# Patient Record
Sex: Female | Born: 1995 | Race: Asian | Hispanic: No | Marital: Married | Smoking: Never smoker
Health system: Southern US, Community
[De-identification: ages and names within clinical notes are randomized; demographics above are authoritative.]

## PROBLEM LIST (undated history)

## (undated) DIAGNOSIS — Z789 Other specified health status: Secondary | ICD-10-CM

## (undated) HISTORY — PX: NO PAST SURGERIES: SHX2092

---

## 2017-01-31 ENCOUNTER — Encounter: Payer: Self-pay | Admitting: *Deleted

## 2017-01-31 LAB — PREGNANCY, URINE: PREG TEST UR: POSITIVE

## 2017-02-20 ENCOUNTER — Encounter: Payer: Self-pay | Admitting: Obstetrics and Gynecology

## 2017-02-20 ENCOUNTER — Ambulatory Visit (INDEPENDENT_AMBULATORY_CARE_PROVIDER_SITE_OTHER): Payer: Medicaid Other | Admitting: Obstetrics and Gynecology

## 2017-02-20 DIAGNOSIS — Z3402 Encounter for supervision of normal first pregnancy, second trimester: Secondary | ICD-10-CM | POA: Diagnosis present

## 2017-02-20 DIAGNOSIS — Z331 Pregnant state, incidental: Secondary | ICD-10-CM | POA: Diagnosis not present

## 2017-02-20 DIAGNOSIS — Z1389 Encounter for screening for other disorder: Secondary | ICD-10-CM | POA: Diagnosis not present

## 2017-02-20 DIAGNOSIS — Z113 Encounter for screening for infections with a predominantly sexual mode of transmission: Secondary | ICD-10-CM

## 2017-02-20 DIAGNOSIS — Z3400 Encounter for supervision of normal first pregnancy, unspecified trimester: Secondary | ICD-10-CM

## 2017-02-20 DIAGNOSIS — O0990 Supervision of high risk pregnancy, unspecified, unspecified trimester: Secondary | ICD-10-CM | POA: Insufficient documentation

## 2017-02-20 LAB — POCT URINALYSIS DIP (DEVICE)
BILIRUBIN URINE: NEGATIVE
GLUCOSE, UA: NEGATIVE mg/dL
KETONES UR: NEGATIVE mg/dL
Nitrite: NEGATIVE
PROTEIN: 30 mg/dL — AB
Urobilinogen, UA: 0.2 mg/dL (ref 0.0–1.0)
pH: 6.5 (ref 5.0–8.0)

## 2017-02-20 MED ORDER — PRENATAL VITAMINS 28-0.8 MG PO TABS: 1.0000 | ORAL_TABLET | Freq: Every day | ORAL | 3 refills | Status: DC

## 2017-02-20 NOTE — Progress Notes (Signed)
     Subjective:   Donna Chen is a 21 y.o. G1P0 at [redacted]w[redacted]d by LMP being seen today for her first obstetrical visit.  Her obstetrical history is significant for healthy female . From [place], speaks english. Has been in [place] for 5 years.  Patient does intend to breast feed. Pregnancy history fully reviewed. FOB involved, living together. Planned pregnancy.   Patient reports no complaints.  HISTORY: Obstetric History   G1   P0   T0   P0   A0   L0    SAB0   TAB0   Ectopic0   Multiple0   Live Births0     # Outcome Date GA Lbr Len/2nd Weight Sex Delivery Anes PTL Lv  1 Current              History reviewed. No pertinent past medical history. History reviewed. No pertinent surgical history. History reviewed. No pertinent family history. Social History  Substance Use Topics  . Smoking status: Never Smoker  . Smokeless tobacco: Never Used  . Alcohol use No   Not on File No current outpatient prescriptions on file prior to visit.   No current facility-administered medications on file prior to visit.      Exam   Vitals:   02/20/17 0942  BP: 113/70  Pulse: 77  Weight: 113 lb 11.2 oz (51.6 kg)   Fetal Heart Rate (bpm): 155  Uterus:     Pelvic Exam: Perineum: no hemorrhoids, normal perineum   Vulva: normal external genitalia, no lesions   Vagina:  normal mucosa, normal discharge   Bony Pelvis: Average-small  System: General: well-developed, well-nourished female in no acute distress   Breast:  normal appearance, no masses or tenderness   Skin: normal coloration and turgor, no rashes   Neurologic: oriented, normal, negative, normal mood   Extremities: normal strength, tone, and muscle mass, ROM of all joints is normal   HEENT PERRLA, extraocular movement intact and sclera clear, anicteric   Mouth/Teeth mucous membranes moist, pharynx normal without lesions and dental hygiene good   Neck supple and no masses   Cardiovascular: regular rate and rhythm   Respiratory:  no  respiratory distress, normal breath sounds   Abdomen: soft, non-tender; bowel sounds normal; no masses,  no organomegaly     Assessment:   Pregnancy: G1P0 Patient Active Problem List   Diagnosis Date Noted  . Supervision of normal first pregnancy, antepartum 02/20/2017     Plan:   1. Supervision of normal first pregnancy, antepartum  - Obstetric Panel, Including HIV - Culture, OB Urine - Hemoglobinopathy evaluation - Cervicovaginal ancillary only - [place] MFM OB COMP + 14 WK; Future   Initial labs drawn. Continue prenatal vitamins. RX sent  Genetic Screening discusseddeclined at this time. Patient is self pay. Has applied for Medicaid. Would like to wait to discuss once medicaid is approved and active.  Ultrasound discussed; fetal anatomic survey: Ordered and scheduled  Problem list reviewed and updated. The nature of [facility] with multiple MDs and other Advanced Practice Providers was explained to patient; also emphasized that residents, students are part of our team. Routine obstetric precautions reviewed. Return in about 4 weeks (around 03/20/2017).   Rasch, Harolyn Rutherford, NP  [facility] [employer], [facility]

## 2017-02-21 LAB — HEMOGLOBINOPATHY EVALUATION
HEMOGLOBIN A2 QUANTITATION: 2.6 % (ref 1.8–3.2)
HEMOGLOBIN F QUANTITATION: 0 % (ref 0.0–2.0)
HGB A: 97.4 % (ref 96.4–98.8)
HGB C: 0 %
HGB S: 0 %
HGB VARIANT: 0 %

## 2017-02-21 LAB — OBSTETRIC PANEL, INCLUDING HIV
Antibody Screen: NEGATIVE
BASOS ABS: 0 10*3/uL (ref 0.0–0.2)
Basos: 0 %
EOS (ABSOLUTE): 0.1 10*3/uL (ref 0.0–0.4)
Eos: 1 %
HIV Screen 4th Generation wRfx: NONREACTIVE
Hematocrit: 35.4 % (ref 34.0–46.6)
Hemoglobin: 11.5 g/dL (ref 11.1–15.9)
Hepatitis B Surface Ag: NEGATIVE
IMMATURE GRANS (ABS): 0.1 10*3/uL (ref 0.0–0.1)
IMMATURE GRANULOCYTES: 1 %
LYMPHS: 15 %
Lymphocytes Absolute: 1.5 10*3/uL (ref 0.7–3.1)
MCH: 27.2 pg (ref 26.6–33.0)
MCHC: 32.5 g/dL (ref 31.5–35.7)
MCV: 84 fL (ref 79–97)
MONOCYTES: 4 %
MONOS ABS: 0.4 10*3/uL (ref 0.1–0.9)
NEUTROS PCT: 79 %
Neutrophils Absolute: 8 10*3/uL — ABNORMAL HIGH (ref 1.4–7.0)
Platelets: 215 10*3/uL (ref 150–379)
RBC: 4.23 x10E6/uL (ref 3.77–5.28)
RDW: 13.6 % (ref 12.3–15.4)
RPR Ser Ql: NONREACTIVE
RUBELLA: 2.06 {index} (ref >0.99)
Rh Factor: POSITIVE
WBC: 10.1 10*3/uL (ref 3.4–10.8)

## 2017-02-21 LAB — CERVICOVAGINAL ANCILLARY ONLY
BACTERIAL VAGINITIS: NEGATIVE
Candida vaginitis: POSITIVE — AB
Chlamydia: NEGATIVE
NEISSERIA GONORRHEA: NEGATIVE
TRICH (WINDOWPATH): NEGATIVE

## 2017-02-22 ENCOUNTER — Telehealth: Payer: Self-pay | Admitting: Obstetrics and Gynecology

## 2017-02-22 LAB — CULTURE, OB URINE

## 2017-02-22 LAB — URINE CULTURE, OB REFLEX

## 2017-02-22 MED ORDER — TERCONAZOLE 0.4 % VA CREA: 1.0000 | TOPICAL_CREAM | Freq: Every day | VAGINAL | 0 refills | Status: DC

## 2017-02-22 NOTE — Telephone Encounter (Signed)
+   yeast infection Terazol cream sent to pharmacy.    Roslynn Holte, Harolyn Rutherford, NP

## 2017-03-20 ENCOUNTER — Ambulatory Visit (INDEPENDENT_AMBULATORY_CARE_PROVIDER_SITE_OTHER): Payer: Medicaid Other | Admitting: Obstetrics & Gynecology

## 2017-03-20 DIAGNOSIS — Z3400 Encounter for supervision of normal first pregnancy, unspecified trimester: Secondary | ICD-10-CM

## 2017-03-20 DIAGNOSIS — Z23 Encounter for immunization: Secondary | ICD-10-CM

## 2017-03-20 DIAGNOSIS — Z3402 Encounter for supervision of normal first pregnancy, second trimester: Secondary | ICD-10-CM

## 2017-03-20 MED ORDER — TERCONAZOLE 0.4 % VA CREA: 1.0000 | TOPICAL_CREAM | Freq: Every day | VAGINAL | 0 refills | Status: DC

## 2017-03-20 NOTE — Progress Notes (Signed)
   PRENATAL VISIT NOTE  Subjective:  Donna Chen is a 21 y.o. G1P0 at [redacted]w[redacted]d being seen today for ongoing prenatal care.  She is currently monitored for the following issues for this low-risk pregnancy and has Supervision of normal first pregnancy, antepartum on her problem list.  Patient reports no complaints.  Contractions: Not present. Vag. Bleeding: None.  Movement: Absent. Denies leaking of fluid.   The following portions of the patient's history were reviewed and updated as appropriate: allergies, current medications, past family history, past medical history, past social history, past surgical history and problem list. Problem list updated.  Objective:   Vitals:   03/20/17 1056 03/20/17 1100  BP: 126/77   Pulse: 80   Weight: 117 lb 14.4 oz (53.5 kg)   Height:   (1.448 m)    Fetal Status:     Movement: Absent     General:  Alert, oriented and cooperative. Patient is in no acute distress.  Skin: Skin is warm and dry. No rash noted.   Cardiovascular: Normal heart rate noted  Respiratory: Normal respiratory effort, no problems with respiration noted  Abdomen: Soft, gravid, appropriate for gestational age.  Pain/Pressure: Absent     Pelvic: Cervical exam deferred        Extremities: Normal range of motion.  Edema: None  Mental Status:  Normal mood and affect. Normal behavior. Normal judgment and thought content.   Assessment and Plan:  Pregnancy: G1P0 at [redacted]w[redacted]d  1. Supervision of normal first pregnancy, antepartum -declines all genetic screening -Pt did not complete Terazol.  Will reorder.  Still having some itching.    Preterm labor symptoms and general obstetric precautions including but not limited to vaginal bleeding, contractions, leaking of fluid and fetal movement were reviewed in detail with the patient. Please refer to After Visit Summary for other counseling recommendations.  Return in about 4 weeks (around 04/17/2017).   Elsie Lincoln, MD

## 2017-03-20 NOTE — Addendum Note (Signed)
Addended by: Lorelle Gibbs L on: 03/20/2017 11:32 AM   Modules accepted: Orders

## 2017-03-25 ENCOUNTER — Other Ambulatory Visit: Payer: Self-pay | Admitting: Obstetrics and Gynecology

## 2017-03-25 ENCOUNTER — Ambulatory Visit (HOSPITAL_COMMUNITY)
Admission: RE | Admit: 2017-03-25 | Discharge: 2017-03-25 | Disposition: A | Discharge status: Home / Self Care | Payer: Medicaid Other | Source: Ambulatory Visit | Attending: Obstetrics and Gynecology | Admitting: Obstetrics and Gynecology

## 2017-03-25 ENCOUNTER — Other Ambulatory Visit (HOSPITAL_COMMUNITY): Payer: Self-pay | Admitting: *Deleted

## 2017-03-25 DIAGNOSIS — Z3A19 19 weeks gestation of pregnancy: Secondary | ICD-10-CM | POA: Diagnosis not present

## 2017-03-25 DIAGNOSIS — O30032 Twin pregnancy, monochorionic/diamniotic, second trimester: Secondary | ICD-10-CM | POA: Insufficient documentation

## 2017-03-25 DIAGNOSIS — O30039 Twin pregnancy, monochorionic/diamniotic, unspecified trimester: Secondary | ICD-10-CM

## 2017-03-25 DIAGNOSIS — Z3689 Encounter for other specified antenatal screening: Secondary | ICD-10-CM

## 2017-03-25 DIAGNOSIS — Z3400 Encounter for supervision of normal first pregnancy, unspecified trimester: Secondary | ICD-10-CM

## 2017-03-25 NOTE — Addendum Note (Signed)
Encounter addended by: Levonne Hubert, RDMS, RVT on: 03/25/2017  4:17 PM<BR>    Actions taken: Imaging Exam ended

## 2017-04-08 ENCOUNTER — Other Ambulatory Visit (HOSPITAL_COMMUNITY): Payer: Self-pay | Admitting: Maternal and Fetal Medicine

## 2017-04-08 ENCOUNTER — Ambulatory Visit (HOSPITAL_COMMUNITY)
Admission: RE | Admit: 2017-04-08 | Discharge: 2017-04-08 | Disposition: A | Discharge status: Home / Self Care | Payer: Medicaid Other | Source: Ambulatory Visit | Attending: Obstetrics and Gynecology | Admitting: Obstetrics and Gynecology

## 2017-04-08 ENCOUNTER — Encounter (HOSPITAL_COMMUNITY): Payer: Self-pay

## 2017-04-08 DIAGNOSIS — O30039 Twin pregnancy, monochorionic/diamniotic, unspecified trimester: Secondary | ICD-10-CM

## 2017-04-08 DIAGNOSIS — O30032 Twin pregnancy, monochorionic/diamniotic, second trimester: Secondary | ICD-10-CM | POA: Insufficient documentation

## 2017-04-08 DIAGNOSIS — Z3A21 21 weeks gestation of pregnancy: Secondary | ICD-10-CM | POA: Diagnosis not present

## 2017-04-08 HISTORY — DX: Other specified health status: Z78.9

## 2017-04-09 ENCOUNTER — Other Ambulatory Visit (HOSPITAL_COMMUNITY): Payer: Self-pay | Admitting: *Deleted

## 2017-04-09 DIAGNOSIS — O30032 Twin pregnancy, monochorionic/diamniotic, second trimester: Secondary | ICD-10-CM

## 2017-04-17 ENCOUNTER — Ambulatory Visit (INDEPENDENT_AMBULATORY_CARE_PROVIDER_SITE_OTHER): Payer: Medicaid Other | Admitting: Obstetrics and Gynecology

## 2017-04-17 VITALS — BP 122/79 | HR 80 | Wt 123.9 lb

## 2017-04-17 DIAGNOSIS — O30039 Twin pregnancy, monochorionic/diamniotic, unspecified trimester: Secondary | ICD-10-CM

## 2017-04-17 DIAGNOSIS — O30032 Twin pregnancy, monochorionic/diamniotic, second trimester: Secondary | ICD-10-CM | POA: Diagnosis not present

## 2017-04-17 DIAGNOSIS — O0992 Supervision of high risk pregnancy, unspecified, second trimester: Secondary | ICD-10-CM | POA: Diagnosis not present

## 2017-04-17 MED ORDER — ASPIRIN EC 81 MG PO TBEC: 81.0000 mg | DELAYED_RELEASE_TABLET | Freq: Every day | ORAL | 2 refills | Status: DC

## 2017-04-17 NOTE — Progress Notes (Signed)
Subjective:  Donna Chen is a 21 y.o. G1P0 at 7072w4d being seen today for ongoing prenatal care.  She is currently monitored for the following issues for this high-risk pregnancy and has Pregnancy, supervision, high-risk and Monochorionic diamniotic twin pregnancy, antepartum on her problem list.  Patient reports no complaints.  Contractions: Not present. Vag. Bleeding: None.  Movement: Present. Denies leaking of fluid.   The following portions of the patient's history were reviewed and updated as appropriate: allergies, current medications, past family history, past medical history, past social history, past surgical history and problem list. Problem list updated.  Objective:   Vitals:   04/17/17 1035  BP: 122/79  Pulse: 80  Weight: 123 lb 14.4 oz (56.2 kg)    Fetal Status: Fetal Heart Rate (bpm): 157/150   Movement: Present     General:  Alert, oriented and cooperative. Patient is in no acute distress.  Skin: Skin is warm and dry. No rash noted.   Cardiovascular: Normal heart rate noted  Respiratory: Normal respiratory effort, no problems with respiration noted  Abdomen: Soft, gravid, appropriate for gestational age. Pain/Pressure: Present     Pelvic:  Cervical exam deferred        Extremities: Normal range of motion.  Edema: None  Mental Status: Normal mood and affect. Normal behavior. Normal judgment and thought content.   Urinalysis:      Assessment and Plan:  Pregnancy: G1P0 at 3672w4d  1. Monochorionic diamniotic twin pregnancy, antepartum U/S follow next week - aspirin EC 81 MG tablet; Take 1 tablet (81 mg total) by mouth daily. Take after 12 weeks for prevention of preeclampsia later in pregnancy  Dispense: 300 tablet; Refill: 2  2. Supervision of high risk pregnancy in second trimester Declines flu vaccine Declines genetic testing Glucola next visit  Preterm labor symptoms and general obstetric precautions including but not limited to vaginal bleeding, contractions,  leaking of fluid and fetal movement were reviewed in detail with the patient. Please refer to After Visit Summary for other counseling recommendations.  Return in about 4 weeks (around 05/15/2017) for OB visit.   Hermina StaggersErvin, Ethal Gotay L, MD

## 2017-04-17 NOTE — Progress Notes (Signed)
Declined genetic testing.  

## 2017-04-22 ENCOUNTER — Other Ambulatory Visit (HOSPITAL_COMMUNITY): Payer: Self-pay | Admitting: Maternal and Fetal Medicine

## 2017-04-22 ENCOUNTER — Encounter (HOSPITAL_COMMUNITY): Payer: Self-pay

## 2017-04-22 ENCOUNTER — Ambulatory Visit (HOSPITAL_COMMUNITY)
Admission: RE | Admit: 2017-04-22 | Discharge: 2017-04-22 | Disposition: A | Discharge status: Home / Self Care | Payer: Medicaid Other | Source: Ambulatory Visit | Attending: Obstetrics and Gynecology | Admitting: Obstetrics and Gynecology

## 2017-04-22 DIAGNOSIS — O30032 Twin pregnancy, monochorionic/diamniotic, second trimester: Secondary | ICD-10-CM | POA: Insufficient documentation

## 2017-04-22 DIAGNOSIS — O43022 Fetus-to-fetus placental transfusion syndrome, second trimester: Secondary | ICD-10-CM | POA: Insufficient documentation

## 2017-04-22 DIAGNOSIS — O30039 Twin pregnancy, monochorionic/diamniotic, unspecified trimester: Secondary | ICD-10-CM

## 2017-04-22 DIAGNOSIS — Z3A23 23 weeks gestation of pregnancy: Secondary | ICD-10-CM | POA: Diagnosis not present

## 2017-04-28 ENCOUNTER — Encounter (HOSPITAL_COMMUNITY): Payer: Self-pay

## 2017-05-06 ENCOUNTER — Ambulatory Visit (HOSPITAL_COMMUNITY)
Admission: RE | Admit: 2017-05-06 | Discharge: 2017-05-06 | Disposition: A | Discharge status: Home / Self Care | Payer: Medicaid Other | Source: Ambulatory Visit | Attending: Obstetrics and Gynecology | Admitting: Obstetrics and Gynecology

## 2017-05-06 ENCOUNTER — Encounter (HOSPITAL_COMMUNITY): Payer: Self-pay

## 2017-05-06 ENCOUNTER — Other Ambulatory Visit (HOSPITAL_COMMUNITY): Payer: Self-pay | Admitting: Maternal and Fetal Medicine

## 2017-05-06 DIAGNOSIS — O30032 Twin pregnancy, monochorionic/diamniotic, second trimester: Secondary | ICD-10-CM | POA: Insufficient documentation

## 2017-05-06 DIAGNOSIS — IMO0002 Reserved for concepts with insufficient information to code with codable children: Secondary | ICD-10-CM

## 2017-05-06 DIAGNOSIS — Z3A25 25 weeks gestation of pregnancy: Secondary | ICD-10-CM

## 2017-05-06 DIAGNOSIS — O365922 Maternal care for other known or suspected poor fetal growth, second trimester, fetus 2: Secondary | ICD-10-CM | POA: Diagnosis not present

## 2017-05-06 DIAGNOSIS — O30039 Twin pregnancy, monochorionic/diamniotic, unspecified trimester: Secondary | ICD-10-CM

## 2017-05-07 ENCOUNTER — Other Ambulatory Visit (HOSPITAL_COMMUNITY): Payer: Self-pay | Admitting: *Deleted

## 2017-05-07 DIAGNOSIS — O365990 Maternal care for other known or suspected poor fetal growth, unspecified trimester, not applicable or unspecified: Secondary | ICD-10-CM

## 2017-05-13 ENCOUNTER — Other Ambulatory Visit (HOSPITAL_COMMUNITY): Payer: Self-pay | Admitting: Maternal and Fetal Medicine

## 2017-05-13 ENCOUNTER — Other Ambulatory Visit: Payer: Self-pay

## 2017-05-13 ENCOUNTER — Inpatient Hospital Stay (HOSPITAL_COMMUNITY)
Admission: AD | Admit: 2017-05-13 | Discharge: 2017-05-16 | DRG: 833 | Disposition: A | Discharge status: Home / Self Care | Payer: Medicaid Other | Source: Ambulatory Visit | Attending: Obstetrics and Gynecology | Admitting: Obstetrics and Gynecology

## 2017-05-13 ENCOUNTER — Encounter (HOSPITAL_COMMUNITY): Payer: Self-pay

## 2017-05-13 ENCOUNTER — Ambulatory Visit (HOSPITAL_COMMUNITY)
Admission: RE | Admit: 2017-05-13 | Discharge: 2017-05-13 | Disposition: A | Discharge status: Home / Self Care | Payer: Medicaid Other | Source: Ambulatory Visit | Attending: Obstetrics and Gynecology | Admitting: Obstetrics and Gynecology

## 2017-05-13 DIAGNOSIS — O30032 Twin pregnancy, monochorionic/diamniotic, second trimester: Secondary | ICD-10-CM | POA: Diagnosis present

## 2017-05-13 DIAGNOSIS — O30009 Twin pregnancy, unspecified number of placenta and unspecified number of amniotic sacs, unspecified trimester: Secondary | ICD-10-CM | POA: Diagnosis present

## 2017-05-13 DIAGNOSIS — O365990 Maternal care for other known or suspected poor fetal growth, unspecified trimester, not applicable or unspecified: Secondary | ICD-10-CM

## 2017-05-13 DIAGNOSIS — O365992 Maternal care for other known or suspected poor fetal growth, unspecified trimester, fetus 2: Secondary | ICD-10-CM

## 2017-05-13 DIAGNOSIS — O283 Abnormal ultrasonic finding on antenatal screening of mother: Secondary | ICD-10-CM

## 2017-05-13 DIAGNOSIS — Z7982 Long term (current) use of aspirin: Secondary | ICD-10-CM

## 2017-05-13 DIAGNOSIS — Z3A26 26 weeks gestation of pregnancy: Secondary | ICD-10-CM

## 2017-05-13 DIAGNOSIS — O0990 Supervision of high risk pregnancy, unspecified, unspecified trimester: Secondary | ICD-10-CM

## 2017-05-13 DIAGNOSIS — O365912 Maternal care for other known or suspected poor fetal growth, first trimester, fetus 2: Secondary | ICD-10-CM

## 2017-05-13 DIAGNOSIS — O365922 Maternal care for other known or suspected poor fetal growth, second trimester, fetus 2: Principal | ICD-10-CM | POA: Diagnosis present

## 2017-05-13 LAB — CBC
HCT: 35.4 % — ABNORMAL LOW (ref 36.0–46.0)
Hemoglobin: 11.7 g/dL — ABNORMAL LOW (ref 12.0–15.0)
MCH: 27.7 pg (ref 26.0–34.0)
MCHC: 33.1 g/dL (ref 30.0–36.0)
MCV: 83.9 fL (ref 78.0–100.0)
PLATELETS: 244 10*3/uL (ref 150–400)
RBC: 4.22 MIL/uL (ref 3.87–5.11)
RDW: 13.6 % (ref 11.5–15.5)
WBC: 12.7 10*3/uL — ABNORMAL HIGH (ref 4.0–10.5)

## 2017-05-13 LAB — TYPE AND SCREEN
ABO/RH(D): B POS
Antibody Screen: NEGATIVE

## 2017-05-13 LAB — ABO/RH: ABO/RH(D): B POS

## 2017-05-13 MED ORDER — CALCIUM CARBONATE ANTACID 500 MG PO CHEW: 2.0000 | CHEWABLE_TABLET | ORAL | Status: DC | PRN

## 2017-05-13 MED ORDER — PRENATAL MULTIVITAMIN CH
1.0000 | ORAL_TABLET | Freq: Every day | ORAL | Status: DC
  Administered 2017-05-14 – 2017-05-16 (×3): 1 via ORAL
  Filled 2017-05-13 (×3): qty 1

## 2017-05-13 MED ORDER — BETAMETHASONE SOD PHOS & ACET 6 (3-3) MG/ML IJ SUSP
12.0000 mg | INTRAMUSCULAR | Status: AC
  Administered 2017-05-13 – 2017-05-14 (×2): 12 mg via INTRAMUSCULAR
  Filled 2017-05-13 (×2): qty 2

## 2017-05-13 MED ORDER — DOCUSATE SODIUM 100 MG PO CAPS
100.0000 mg | ORAL_CAPSULE | Freq: Every day | ORAL | Status: DC
  Administered 2017-05-14 – 2017-05-16 (×2): 100 mg via ORAL
  Filled 2017-05-13 (×2): qty 1

## 2017-05-13 MED ORDER — ACETAMINOPHEN 325 MG PO TABS: 650.0000 mg | ORAL_TABLET | ORAL | Status: DC | PRN

## 2017-05-13 MED ORDER — LACTATED RINGERS IV SOLN
INTRAVENOUS | Status: DC
  Administered 2017-05-13 – 2017-05-14 (×2): via INTRAVENOUS

## 2017-05-13 NOTE — H&P (Signed)
Obstetric History and Physical  Donna Chen is a 21 y.o. G1P0 with mono/di twins at [redacted]w[redacted]d admitted for UA dopplers with persistent and intermittently reversed end diastolic flow in Twin B. Growth discordance today 22%. She previously (10/23) was noted to have growth discordance of 28% and abnormal umbilical artery dopplers in Twin B and was sent to [facility] for concern for Stage III TTTS. She reports they told her everything was normal. Returned today for follow up US and was noted to have intermittently reversed end diastolic flow in Twin B today.   Patient has no complaints today. Denies contractions, leaking, bleeding. Reports normal fetal movement. Reports aside from growth issues, she has had no issues with pregnancy.     Prenatal Course Source of Care: [facility]  with onset of care at 14 weeks Pregnancy complications or risks: Patient Active Problem List   Diagnosis Date Noted  . Inter-twin discordance, antepartum 05/13/2017  . Twin pregnancy, twins discordant 05/13/2017  . Monochorionic diamniotic twin pregnancy, antepartum 04/17/2017  . Pregnancy, supervision, high-risk 02/20/2017   Prenatal labs and studies: ABO, Rh: --/--/B POS (11/13 1504) Antibody: NEG (11/13 1504) Rubella: 2.06 (08/23 1023) RPR: Non Reactive (08/23 1023)  HBsAg: Negative (08/23 1023)  HIV:   non-reactive GBS: not done yet 2 hr Glucola  Not done yet Genetic screening declined Anatomy US normal  Prenatal Transfer Tool  Maternal Diabetes: not yet tested Genetic Screening: Declined Maternal Ultrasounds/Referrals: Abnormal:  Findings:   Other: Fetal Ultrasounds or other Referrals:  Referred to Materal Fetal Medicine  Maternal Substance Abuse:  No Significant Maternal Medications:  None Significant Maternal Lab Results: None  Past Medical History:  Diagnosis Date  . Medical history non-contributory     Past Surgical History:  Procedure Laterality Date  . NO PAST SURGERIES      OB History  Gravida Para  Term Preterm AB Living  1         0  SAB TAB Ectopic Multiple Live Births               # Outcome Date GA Lbr Len/2nd Weight Sex Delivery Anes PTL Lv  1 Current               Social History   Socioeconomic History  . Marital status: Married    Spouse name: Not on file  . Number of children: Not on file  . Years of education: Not on file  . Highest education level: Not on file  Social Needs  . Financial resource strain: Not on file  . Food insecurity - worry: Not on file  . Food insecurity - inability: Not on file  . Transportation needs - medical: Not on file  . Transportation needs - non-medical: Not on file  Occupational History  . Not on file  Tobacco Use  . Smoking status: Never Smoker  . Smokeless tobacco: Never Used  Substance and Sexual Activity  . Alcohol use: No  . Drug use: No  . Sexual activity: Yes    Birth control/protection: None  Other Topics Concern  . Not on file  Social History Narrative  . Not on file    No family history on file.  Medications Prior to Admission  Medication Sig Dispense Refill Last Dose  . aspirin EC 81 MG tablet Take 1 tablet (81 mg total) by mouth daily. Take after 12 weeks for prevention of preeclampsia later in pregnancy 300 tablet 2 05/12/2017 at Unknown time  . Prenatal Vit-Fe Fumarate-FA (  PRENATAL VITAMINS) 28-0.8 MG TABS Take 1 tablet by mouth daily. 60 tablet 3 05/12/2017 at Unknown time    No Known Allergies  Review of Systems: Negative except for what is mentioned in HPI.  Physical Exam: BP 112/74 (BP Location: Right Arm)   Pulse 82   Temp 98.6 F (37 C) (Oral)   Resp 18   Ht 4\' 9"  (1.448 m)   Wt 131 lb (59.4 kg)   LMP 11/10/2016 (Exact Date)   SpO2 99%   BMI 28.35 kg/m  CONSTITUTIONAL: Well-developed, well-nourished female in no acute distress.  HENT:  Normocephalic, atraumatic, External right and left ear normal. Oropharynx is clear and moist EYES: Conjunctivae and EOM are normal. Pupils are equal,  round, and reactive to light. No scleral icterus.  NECK: Normal range of motion, supple, no masses SKIN: Skin is warm and dry. No rash noted. Not diaphoretic. No erythema. No pallor. NEUROLOGIC: Alert and oriented to person, place, and time. Normal reflexes, muscle tone coordination. No cranial nerve deficit noted. PSYCHIATRIC: Normal mood and affect. Normal behavior. Normal judgment and thought content. CARDIOVASCULAR: Normal heart rate noted, regular rhythm RESPIRATORY: Effort and breath sounds normal, no problems with respiration noted ABDOMEN: Soft, nontender, nondistended, gravid. MUSCULOSKELETAL: Normal range of motion. No edema and no tenderness. 2+ distal pulses.    Pertinent Labs/Studies:    OBSTETRICS REPORT                      (Signed Final 05/13/2017 02:21 pm) ---------------------------------------------------------------------- Patient Info  ID #:       [id number]                          D.O.B.:  [birth year] (21 yrs)  Name:       Donna Chen                    Visit Date: 05/13/2017 12:32 pm ---------------------------------------------------------------------- Performed By  Performed By:     Lenise Arena        Ref. Address:     [facility]                    [facility]                                                             [facility]                                                             [address]                                                             [address]  [place], [place]                                                             [phone]  Attending:        Darlyn ReadEmily Bunce MD         [facility]  Referred By:      [facility]                    Center for                    [facility] ---------------------------------------------------------------------- Orders   #  Description                                 Code   1  US MFM OB FOLLOW  UP                         [id number]   2  US MFM OB FOLLOW UP ADDL GEST               76816.02   3  US MFM UA CORD DOPPLER                      76820.02   4  US MFM UA DOPPLER ADDL GEST RE              76820.03      EVAL  ----------------------------------------------------------------------   #  Ordered By               Order #        Accession #    Episode #   1  MARTHA DECKER            [id number]      [id number]     [id number]   2  MARTHA DECKER            [id number]      [id number]     [id number]   3  MARTHA DECKER            [id number]      [phone]     [id number]   4  MARTHA DECKER            [id number]      [id number] [phone]     [id number]  ---------------------------------------------------------------------- Indications   [redacted] weeks gestation of pregnancy                Z3A.26   Twin pregnancy, mono/di, second trimester      O30.032   Maternal care for known or suspected poor      O36.5922   fetal growth, second trimester, fetus 2  ---------------------------------------------------------------------- OB History  Gravidity:    1         Term:   0        Prem:   0        SAB:   0  TOP:          0  Ectopic:  0        Living: 0 ---------------------------------------------------------------------- Fetal Evaluation (Fetus A)  Num Of Fetuses:     2  Cardiac Activity:   Observed  Fetal Lie:          Right Fetus  Presentation:       Cephalic  Placenta:           Anterior, above cervical os  P. Cord Insertion:  Previously Visualized  Amniotic Fluid  AFI FV:      Subjectively within normal limits                              Largest Pocket(cm)                              3.76 ---------------------------------------------------------------------- Biometry (Fetus A)  BPD:      64.4  mm     G. Age:  26w 0d         31  %    CI:        78.75   %    70 - 86                                                          FL/HC:      20.6   %    18.6 - 20.4  HC:      229.5  mm     G. Age:  25w 0d         < 3  %    HC/AC:      1.02        1.04 - 1.22  AC:      224.8  mm     G. Age:  26w 6d         60  %    FL/BPD:     73.4   %    71 - 87  FL:       47.3  mm     G. Age:  25w 6d         22  %    FL/AC:      21.0   %    20 - 24  HUM:      45.2  mm     G. Age:  26w 5d         57  %  Est. FW:     913  gm           2 lb     53  %     FW Discordancy     0 \ 22 % ---------------------------------------------------------------------- Gestational Age (Fetus A)  LMP:           26w 2d        Date:  11/10/16                 EDD:   08/17/17  U/S Today:     26w 0d  EDD:   08/19/17  Best:          Altamese Cabal 2d     Det. By:  LMP  (11/10/16)          EDD:   08/17/17 ---------------------------------------------------------------------- Anatomy (Fetus A)  Cranium:               Appears normal         Aortic Arch:            Previously seen  Cavum:                 Appears normal         Ductal Arch:            Previously seen  Ventricles:            Appears normal         Diaphragm:              Appears normal  Choroid Plexus:        Previously seen        Stomach:                Appears normal, left                                                                        sided  Cerebellum:            Previously seen        Abdomen:                Appears normal  Posterior Fossa:       Previously seen        Abdominal Wall:         Previously seen  Nuchal Fold:           Previously seen        Cord Vessels:           Previously seen  Face:                  Orbits and profile     Kidneys:                Previously seen                         previously seen  Lips:                  Previously seen        Bladder:                Appears normal  Thoracic:              Appears normal         Spine:                  Previously seen  Heart:                 Appears normal         Upper Extremities:      Previously seen                         (  4CH, axis, and situs  RVOT:                   Appears normal         Lower Extremities:      Previously seen  LVOT:                  Appears normal ---------------------------------------------------------------------- Doppler - Fetal Vessels (Fetus A)  Umbilical Artery   S/D     %tile     RI              PI              PSV    ADFV    RDFV                                                   (cm/s)  3.92       77   0.74             1.27             48.04      No      No ---------------------------------------------------------------------- Fetal Evaluation (Fetus B)  Num Of Fetuses:     2  Cardiac Activity:   Observed  Fetal Lie:          Left Fetus  Presentation:       Cephalic  Placenta:           Anterior, above cervical os  P. Cord Insertion:  Previously Visualized  Amniotic Fluid  AFI FV:      Subjectively within normal limits                              Largest Pocket(cm)                              4.33 ---------------------------------------------------------------------- Biometry (Fetus B)  BPD:        62  mm     G. Age:  25w 1d         10  %    CI:        78.08   %    70 - 86                                                          FL/HC:      19.1   %    18.6 - 20.4  HC:       222   mm     G. Age:  24w 1d        < 3  %    HC/AC:      1.08        1.04 - 1.22  AC:      204.7  mm     G. Age:  25w 0d         11  %    FL/BPD:     68.4   %    71 - 87  FL:  42.4  mm     G. Age:  23w 6d        < 3  %    FL/AC:      20.7   %    20 - 24  HUM:      39.6  mm     G. Age:  24w 1d        < 5  %  Est. FW:     708  gm      1 lb 9 oz     20  %     FW Discordancy        22  % ---------------------------------------------------------------------- Gestational Age (Fetus B)  LMP:           26w 2d        Date:  11/10/16                 EDD:   08/17/17  U/S Today:     24w 4d                                        EDD:   08/29/17  Best:          26w 2d     Det. By:  LMP  (11/10/16)          EDD:    08/17/17 ---------------------------------------------------------------------- Anatomy (Fetus B)  Cranium:               Appears normal         Aortic Arch:            Previously seen  Cavum:                 Appears normal         Ductal Arch:            Previously seen  Ventricles:            Appears normal         Diaphragm:              Previously seen  Choroid Plexus:        Previously seen        Stomach:                Appears normal, left                                                                        sided  Cerebellum:            Previously seen        Abdomen:                Appears normal  Posterior Fossa:       Previously seen        Abdominal Wall:         Previously seen  Nuchal Fold:           Previously seen        Cord Vessels:           Previously seen  Face:  Orbits and profile     Kidneys:                Previously seen                         previously seen  Lips:                  Previously seen        Bladder:                Appears normal  Thoracic:              Appears normal         Spine:                  Previously seen  Heart:                 Appears normal         Upper Extremities:      Previously seen                         (4CH, axis, and situs  RVOT:                  Appears normal         Lower Extremities:      Previously seen  LVOT:                  Appears normal ---------------------------------------------------------------------- Doppler - Fetal Vessels (Fetus B)  Umbilical Artery                     RI              PI              PSV    ADFV    RDFV                                                   (cm/s)                      1            1.86             29.42     Yes     Yes  Ductus Venosus                                              Normal ---------------------------------------------------------------------- Comments  Previously seen at [facility] for evaluation of TTTS given prior  finding of growth discordance and  abnormal dopplers (absent  end diastolic flow in the UA for twin B). Records from that visit  requested. The patient reports ultrasound findings were  normal at that time. Today's ultrasound findings are most  consistent with selective growth restriction of twin B. Does not  meet criteria for TTTS given normal fluid and bladders for  both babies. ---------------------------------------------------------------------- Impression  Monochorionic-diamniotic twin gestation at 26w 2d.  Twin A:  Cephalic presentation.  Placenta anterior, above cervical os.  Normal amniotic fluid volume. MVP 3.7.  Composite  fetal growth in the 53%ile.  Normal interval fetal anatomy.  Normal UA dopplers in the 77%ile without absent or reversed  flow.  Twin B:  Cephalic presentation.  Placenta anterior, above cervical os.  Normal amniotic fluid volume. MVP 4.33.  Composite fetal growth in the 20%ile. AC 11%ile. HC, FL  <3%ile.  Normal interval fetal anatomy.  UA dopplers with persistent absent and intermittently  reversed end diastolic flow.  Normal DV dopplers.  Stomach and bladder seen x 2.  22% growth discordance. ---------------------------------------------------------------------- Recommendations  Implications of today's ultrasound findings discussion with the  patient and family. Given new reversal in the UA dopplers  recommend admission for fetal monitoring, betamethasone  administration, and neonatology consultation. Recommend  repeat ultrasound for UA and DV dopplers in two days if fetal  status remains reassuring in the interim. Findings and  recommendations discussed with primary OB Dr. Adrian Blackwater. ----------------------------------------------------------------------                   Darlyn Read, MD Electronically Signed Final Report   05/13/2017 02:21 pm  Assessment : Renessa Wellnitz is a 21 y.o. G1P0 at [redacted]w[redacted]d being admitted for observation of mono-di twins with intermittent reversed end  diastolic flow in Twin B and growth discordance of 22%. Reviewed diagnosis and implications with patient, reviewed that with abnormal dopplers, baby may not be getting enough blood flow for adequate growth, oxygenation. Reviewed that with worsening dopplers or non-reassuring fetal testing, she may required delivery and this would be by c-section. Reviewed that for now, we will monitor FHR for both twins and make decisions based on testing. Reviewed antenatal corticosteroid administration for fetal lung maturity and she is agreeable. She verbalized understanding of all of the above and is agreeable to plan. Will remain NPO for now until reactive NST achieved.  Plan:  FWB - continuous NST both twins - ANCS x2 - admit to antepartum - continuous toco - NICU consult placed  Routine antepartum care - SCDs - NPO for now - bed rest with bathroom privileges   K. Therese Sarah, M.D. Attending Obstetrician & Gynecologist, [facility] for [employer], [facility]  05/13/2017, 4:52 PM

## 2017-05-13 NOTE — ED Notes (Signed)
Report called to VentanaMegan, Charity fundraiserN.  Pt to [phone].

## 2017-05-13 NOTE — Consult Note (Signed)
Neonatology Consult to Antenatal Patient: 05/13/2017 6:06 PM    I was requested by Dr Earlene Plateravis to see this patient in order to provide antenatal counseling at 26 2/[redacted] weeks gestation.    Ms.Coykendall is a  21 y/o Primigravida who was admitted today and is now 26 2/[redacted] weeks GA.  Pregnancy complicated by Mono-Di discordant twins with abnormal UA doppler flow in Twin "B", absent and intermittent reversed EDF noted.  Estimated fetal weight for Twin "A" is 913 grams and 708 grams for Twin "B" (which is like a 22% growth discordance).  Mother was admitted for observation and is currently "not" in active labor.  She already received a dose of BMZ.   I spoke with Ms. Dereck LigasMagar and FOB in Room 313.  They are originally from [place] and said they speak and understand [place].  We discussed in detail what to expect in case of possible delivery of the twins in the next few days including morbidity and mortality at this gestational age, usual delivery room resuscitation including intubation and surfactant administration in the DR.  Discussed possible respiratory complications and need for support including mechanical ventilation, IV access, sepsis work-up, NG/OG feedings ( benefits of DBM since MOB does not plan to breast feed ), risk for IVH with the potential for motor/cognitive deficits, length of stay and long-term outcome.  They were attentive, mother had appropriate questions but her partner does not have any questions except he was more concerned as to what is the plan of the OB for Ms. Viscuso. They expressed appreciation for my input.  Thank you for asking us to see this patient and allowing us to participate in her care.  Please call if there are any further questions.   Overton MamMary Ann T Dorathy Stallone, MD (Attending Neonatologist)  Total length of face-to-face or floor/unit time for this encounter was 40 minutes. Counseling and/or coordination of care was greater than fifty percent of the time.

## 2017-05-14 ENCOUNTER — Encounter (HOSPITAL_COMMUNITY): Payer: Self-pay

## 2017-05-14 ENCOUNTER — Encounter (HOSPITAL_COMMUNITY): Payer: Self-pay | Admitting: *Deleted

## 2017-05-14 MED ORDER — MAGNESIUM SULFATE BOLUS VIA INFUSION
4.0000 g | Freq: Once | INTRAVENOUS | Status: AC
  Administered 2017-05-14: 4 g via INTRAVENOUS
  Filled 2017-05-14: qty 500

## 2017-05-14 MED ORDER — MAGNESIUM SULFATE 40 G IN LACTATED RINGERS - SIMPLE
2.0000 g/h | INTRAVENOUS | Status: DC
  Filled 2017-05-14: qty 500

## 2017-05-14 NOTE — Progress Notes (Signed)
FACULTY PRACTICE ANTEPARTUM PROGRESS NOTE  Donna Chen is a 21 y.o. G1P0 at 6434w3d who is admitted for intermittent reversed end diastolic flow of Twin B.  Estimated Date of Delivery: 08/17/17 Fetal presentation is unsure.  Length of Stay:  1 Days. Admitted 05/13/2017  Subjective:  Patient reports normal fetal movement.  She reports no uterine contractions, no bleeding and no loss of fluid per vagina.  Vitals:  Blood pressure 114/73, pulse 82, temperature 97.7 F (36.5 C), temperature source Oral, resp. rate 18, height 4\' 9"  (1.448 m), weight 131 lb (59.4 kg), last menstrual period 11/10/2016, SpO2 100 %. Physical Examination: CONSTITUTIONAL: Well-developed, well-nourished female in no acute distress.  HENT:  Normocephalic, atraumatic, External right and left ear normal. Oropharynx is clear and moist EYES: Conjunctivae and EOM are normal. Pupils are equal, round, and reactive to light. No scleral icterus.  NECK: Normal range of motion, supple, no masses. SKIN: Skin is warm and dry. No rash noted. Not diaphoretic. No erythema. No pallor. NEUROLGIC: Alert and oriented to person, place, and time. Normal reflexes, muscle tone coordination. No cranial nerve deficit noted. PSYCHIATRIC: Normal mood and affect. Normal behavior. Normal judgment and thought content. CARDIOVASCULAR: Normal heart rate noted, regular rhythm RESPIRATORY: Effort and breath sounds normal, no problems with respiration noted MUSCULOSKELETAL: Normal range of motion. No edema and no tenderness. ABDOMEN: Soft, nontender, nondistended, gravid. CERVIX: deferred  Fetal monitoring:  Twin A: FHR: 140s bpm, Variability: moderate, Accelerations: Present, Decelerations: Absent  Twin B: FHR: 140s bpm, Variability: moderate, Accelerations: Present, Decelerations: Absent  Uterine activity: no contractions per hour  Results for orders placed or performed during the hospital encounter of 05/13/17 (from the past 48 hour(s))  Type and  screen [facility]     Status: None   Collection Time: 05/13/17  3:04 PM  Result Value Ref Range   ABO/RH(D) B POS    Antibody Screen NEG    Sample Expiration 05/16/2017   CBC on admission     Status: Abnormal   Collection Time: 05/13/17  3:04 PM  Result Value Ref Range   WBC 12.7 (H) 4.0 - 10.5 K/uL   RBC 4.22 3.87 - 5.11 MIL/uL   Hemoglobin 11.7 (L) 12.0 - 15.0 g/dL   HCT 40.935.4 (L) 81.136.0 - 91.446.0 %   MCV 83.9 78.0 - 100.0 fL   MCH 27.7 26.0 - 34.0 pg   MCHC 33.1 30.0 - 36.0 g/dL   RDW 78.213.6 95.611.5 - 21.315.5 %   Platelets 244 150 - 400 K/uL  ABO/Rh     Status: None   Collection Time: 05/13/17  3:04 PM  Result Value Ref Range   ABO/RH(D) B POS      Current scheduled medications . betamethasone acetate-betamethasone sodium phosphate  12 mg Intramuscular Q24H  . docusate sodium  100 mg Oral Daily  . prenatal multivitamin  1 tablet Oral Q1200    I have reviewed the patient's current medications.  ASSESSMENT: Principal Problem:   Inter-twin discordance, antepartum Active Problems:   Pregnancy, supervision, high-risk   Monochorionic diamniotic twin pregnancy, antepartum   Twin pregnancy, twins discordant   PLAN:  FWB - continuous NST both twins - ANCS x2 - continuous toco - s/p NICU consult - May start MgSO4 today for fetal neuroprotection  Routine antepartum care - SCDs - regular diet - bed rest with bathroom privileges  Reviewed plan of care with patient, answered all questions.   Baldemar LenisK. Meryl Jaelynn Currier, M.D. Attending Obstetrician & Gynecologist, Faculty Practice  Center for [place]

## 2017-05-14 NOTE — Progress Notes (Signed)
Faculty Note  In to discuss recommendation for MgSO4 with patient in the event that dopplers are worsening tomorrow and she needs to be delivered. Reviewed risks of prematurity including developmental issues, respiratory issues, cerebral palsy. Reviewed that if dopplers are concerning, may plan for delivery tomorrow once she is BTMZ complete. Patient and husband with lots of questions about whether babies would have long term issues, they verbalized understanding of discussion with neonatology yesterday and that we cannot predict whether babies will have long term issues. Reiterated that we would deliver them if there is concern for fetal compromise. Answered all questions, they are agreeable to MgSO4 and verbalize understanding of plan.   Baldemar LenisK. Meryl Davis, M.D. Attending Obstetrician & Gynecologist, [facility] for [employer] [facility], [facility]

## 2017-05-14 NOTE — Progress Notes (Signed)
FACULTY PRACTICE ANTEPARTUM(COMPREHENSIVE) NOTE  Donna Chen Stachnik is a 21 y.o. G1P0 at 3165w3d  who is admitted for intermittent reverse end diastolic flow of Twin B of Mono/Di Twins with 22% growth discordance. .   Fetal presentation is unsure. Length of Stay:  1  Days  Subjective: NICU has only 2 beds, will discuss delivery location if delivery considered in am Pt has been on continuous monitor all day without decels. Pt requests a break. Strip reviewed, and will allow a monitor break til 6 am.  Fetal Monitoring:  Baseline: 135/140 bpm, Variability: Fair (1-6 bpm), Accelerations: Non-reactive but appropriate for gestational age and Decelerations: Absent  Labs:  No results found for this or any previous visit (from the past 24 hour(s)).  Imaging Studies:    Marland Kitchen.  Medications:  Scheduled . docusate sodium  100 mg Oral Daily  . prenatal multivitamin  1 tablet Oral Q1200   I have reviewed the patient's current medications.  ASSESSMENT: Patient Active Problem List   Diagnosis Date Noted  . Inter-twin discordance, antepartum 05/13/2017  . Twin pregnancy, twins discordant 05/13/2017  . Monochorionic diamniotic twin pregnancy, antepartum 04/17/2017  . Pregnancy, supervision, high-risk 02/20/2017    PLAN: NPO p MN til dopplers in a.m.  need to discuss NICU status as delivery plans made.  Tilda BurrowFERGUSON,Sohum Delillo V 05/14/2017,9:48 PM    Patient ID: Donna Chen Beldin, female   DOB: 05-29-1996, 21 y.o.   MRN: [id number]

## 2017-05-15 ENCOUNTER — Encounter: Payer: Medicaid Other | Admitting: Student

## 2017-05-15 ENCOUNTER — Inpatient Hospital Stay (HOSPITAL_COMMUNITY): Payer: Medicaid Other

## 2017-05-15 DIAGNOSIS — O283 Abnormal ultrasonic finding on antenatal screening of mother: Secondary | ICD-10-CM

## 2017-05-15 DIAGNOSIS — Z3A26 26 weeks gestation of pregnancy: Secondary | ICD-10-CM

## 2017-05-15 LAB — WET PREP, GENITAL
CLUE CELLS WET PREP: NONE SEEN
SPERM: NONE SEEN
TRICH WET PREP: NONE SEEN
Yeast Wet Prep HPF POC: NONE SEEN

## 2017-05-15 LAB — AMNISURE RUPTURE OF MEMBRANE (ROM) NOT AT ARMC: Amnisure ROM: NEGATIVE

## 2017-05-15 NOTE — Progress Notes (Signed)
Faculty Note  Patient feeling better off MgSO4. Wet prep negative. Will keep overnight for monitoring given absent end diastolic flow in Twin B, patient agreeable to plan.   Baldemar LenisK. Meryl Shawnique Mariotti, M.D. Attending Obstetrician & Gynecologist, [facility], [facility]

## 2017-05-15 NOTE — Progress Notes (Signed)
FACULTY PRACTICE ANTEPARTUM PROGRESS NOTE  Donna Chen is a 21 y.o. G1P0 at 1629w4d who is admitted for intermittent reversed end diastolic flow Twin B and growth discordance 22%.  Estimated Date of Delivery: 08/17/17 Fetal presentation is unsure.  Length of Stay:  2 Days. Admitted 05/13/2017  Subjective:  Patient reports normal fetal movement.  She reports no uterine contractions, no bleeding. She does report wet underwear, not sure if she voided on herself or if she is leaking.  Vitals:  Blood pressure 124/87, pulse 82, temperature 98.4 F (36.9 C), temperature source Oral, resp. rate 18, height 4\' 9"  (1.448 m), weight 131 lb (59.4 kg), last menstrual period 11/10/2016, SpO2 100 %. Physical Examination: CONSTITUTIONAL: Well-developed, well-nourished female in no acute distress.  HENT:  Normocephalic, atraumatic, External right and left ear normal. Oropharynx is clear and moist EYES: Conjunctivae and EOM are normal. Pupils are equal, round, and reactive to light. No scleral icterus.  NECK: Normal range of motion, supple, no masses. SKIN: Skin is warm and dry. No rash noted. Not diaphoretic. No erythema. No pallor. NEUROLGIC: Alert and oriented to person, place, and time. Normal reflexes, muscle tone coordination. No cranial nerve deficit noted. PSYCHIATRIC: Normal mood and affect. Normal behavior. Normal judgment and thought content. CARDIOVASCULAR: Normal heart rate noted, regular rhythm RESPIRATORY: Effort and breath sounds normal, no problems with respiration noted MUSCULOSKELETAL: Normal range of motion. No edema and no tenderness. ABDOMEN: Soft, nontender, nondistended, gravid. CERVIX:    Fetal monitoring:  Twin A: FHR: 120 bpm, Variability: moderate, Accelerations: none, Decelerations: Absent  Twin B:  FHR: 125 bpm, Variability: moderate, Accelerations: none, Decelerations: Absent  Uterine activity: no contractions per hour  Results for orders placed or performed during the  hospital encounter of 05/13/17 (from the past 48 hour(s))  Type and screen [facility]     Status: None   Collection Time: 05/13/17  3:04 PM  Result Value Ref Range   ABO/RH(D) B POS    Antibody Screen NEG    Sample Expiration 05/16/2017   CBC on admission     Status: Abnormal   Collection Time: 05/13/17  3:04 PM  Result Value Ref Range   WBC 12.7 (H) 4.0 - 10.5 K/uL   RBC 4.22 3.87 - 5.11 MIL/uL   Hemoglobin 11.7 (L) 12.0 - 15.0 g/dL   HCT 16.135.4 (L) 09.636.0 - 04.546.0 %   MCV 83.9 78.0 - 100.0 fL   MCH 27.7 26.0 - 34.0 pg   MCHC 33.1 30.0 - 36.0 g/dL   RDW 40.913.6 81.111.5 - 91.415.5 %   Platelets 244 150 - 400 K/uL  ABO/Rh     Status: None   Collection Time: 05/13/17  3:04 PM  Result Value Ref Range   ABO/RH(D) B POS   Amnisure rupture of membrane (rom)not at [facility]     Status: None   Collection Time: 05/15/17 10:30 AM  Result Value Ref Range   Amnisure ROM NEGATIVE   Wet prep, genital     Status: Abnormal   Collection Time: 05/15/17 10:45 AM  Result Value Ref Range   Yeast Wet Prep HPF POC NONE SEEN NONE SEEN   Trich, Wet Prep NONE SEEN NONE SEEN   Clue Cells Wet Prep HPF POC NONE SEEN NONE SEEN   WBC, Wet Prep HPF POC FEW (A) NONE SEEN    Comment: MANY BACTERIA SEEN   Sperm NONE SEEN       Current scheduled medications . docusate sodium  100 mg  Oral Daily  . prenatal multivitamin  1 tablet Oral Q1200    I have reviewed the patient's current medications.  ASSESSMENT: Principal Problem:   Inter-twin discordance, antepartum Active Problems:   Pregnancy, supervision, high-risk   Monochorionic diamniotic twin gestation in second trimester   Twin pregnancy, twins discordant   [redacted] weeks gestation of pregnancy   Abnormal fetal ultrasound   PLAN: FWB - US this am with absent end diastolic flow - continuous NST both twins - dc MgSO4 - ANCS x2 - continuous toco - s/p NICU consult  Routine antepartum care - SCDs - regular diet - bed rest with bathroom  privileges  ? Leaking fluid - amnisure negative - wet prep sent  Possible dc later today if NST reactive, will review.   Reviewed plan of care with patient, answered all questions.   Baldemar LenisK. Meryl Hicks Feick, M.D. Attending Obstetrician & Gynecologist, [facility], [facility]

## 2017-05-16 ENCOUNTER — Other Ambulatory Visit: Payer: Self-pay | Admitting: Obstetrics and Gynecology

## 2017-05-16 DIAGNOSIS — O283 Abnormal ultrasonic finding on antenatal screening of mother: Secondary | ICD-10-CM

## 2017-05-16 NOTE — Discharge Instructions (Signed)

## 2017-05-16 NOTE — Progress Notes (Signed)
FACULTY PRACTICE ANTEPARTUM PROGRESS NOTE  Donna Chen is a 21 y.o. G1P0 at 5873w5d who is admitted for growth discordance 22%, absent end diastolic flow Twin B.  Estimated Date of Delivery: 08/17/17 Fetal presentation is unsure.  Length of Stay:  3 Days. Admitted 05/13/2017  Subjective:  Patient reports normal fetal movement.  She denies uterine contractions, denies  bleeding and leaking of fluid per vagina.  Vitals:  Blood pressure 119/75, pulse 71, temperature 98.2 F (36.8 C), temperature source Oral, resp. rate 16, height 4\' 9"  (1.448 m), weight 131 lb (59.4 kg), last menstrual period 11/10/2016, SpO2 100 %. Physical Examination: CONSTITUTIONAL: Well-developed, well-nourished female in no acute distress.  HENT:  Normocephalic, atraumatic, External right and left ear normal. Oropharynx is clear and moist EYES: Conjunctivae and EOM are normal. Pupils are equal, round, and reactive to light. No scleral icterus.  NECK: Normal range of motion, supple, no masses. SKIN: Skin is warm and dry. No rash noted. Not diaphoretic. No erythema. No pallor. NEUROLGIC: Alert and oriented to person, place, and time. Normal reflexes, muscle tone coordination. No cranial nerve deficit noted. PSYCHIATRIC: Normal mood and affect. Normal behavior. Normal judgment and thought content. CARDIOVASCULAR: Normal heart rate noted, regular rhythm RESPIRATORY: Effort and breath sounds normal, no problems with respiration noted MUSCULOSKELETAL: Normal range of motion. No edema and no tenderness. ABDOMEN: Soft, nontender, nondistended, gravid. CERVIX:   Fetal monitoring:  A: FHR: 140 bpm, Variability: moderate, Accelerations: Present, Decelerations: Absent  B: FHR: 140 bpm, Variability: moderate, Accelerations: Present, Decelerations: Absent  Uterine activity: no contractions per hour  Results for orders placed or performed during the hospital encounter of 05/13/17 (from the past 48 hour(s))  Amnisure rupture [facility]  [facility]rom[facility]     Status: None   Collection Time: 05/15/17 10:30 AM  Result Value Ref Range   Amnisure ROM NEGATIVE   Wet prep, genital     Status: Abnormal   Collection Time: 05/15/17 10:45 AM  Result Value Ref Range   Yeast Wet Prep HPF POC NONE SEEN NONE SEEN   Trich, Wet Prep NONE SEEN NONE SEEN   Clue Cells Wet Prep HPF POC NONE SEEN NONE SEEN   WBC, Wet Prep HPF POC FEW (A) NONE SEEN    Comment: MANY BACTERIA SEEN   Sperm NONE SEEN       Current scheduled medications . docusate sodium  100 mg Oral Daily  . prenatal multivitamin  1 tablet Oral Q1200    I have reviewed the patient's current medications.  ASSESSMENT: Principal Problem:   Inter-twin discordance, antepartum Active Problems:   Pregnancy, supervision, high-risk   Monochorionic diamniotic twin gestation in second trimester   Twin pregnancy, twins discordant   [redacted] weeks gestation of pregnancy   Abnormal fetal ultrasound   PLAN: FWB - US yesterday with absent EDF in B - NST BID - s/p  MgSO4 - s/p ANCS 11/13-14 -s/p NICU consult  Routine antepartum care - SCDs - regular diet - bed rest with bathroom privileges  Will review case with MFM today.  Continue routine antenatal care.   Baldemar LenisK. Meryl Davis, M.D. Attending Obstetrician & Gynecologist, [facility], [facility]

## 2017-05-16 NOTE — Discharge Summary (Signed)
Antenatal Physician Discharge Summary  Patient ID: Bynum Bellowsarita Prue MRN: [id number] DOB/AGE: 1996/02/23 21 y.o.  Admit date: 05/13/2017 Discharge date: 05/16/2017  Admission Diagnoses: mono/di twins with reverse end diastolic flow Twin B, growth discordance 22%  Discharge Diagnoses: mono/di twins with absent end diastolic flow Twin B, growth discordance 22%  Prenatal Procedures: NST, ultrasound and MgSO4, ANCS  Consults: Neonatology, Maternal Fetal Medicine  Hospital Course:  This is a 21 y.o. G1P0 with IUP at 5357w5d admitted for concern for reverse end diastolic flow of Twin B. She had a course of antenatal steroids and 12 hrs MgSO4. Repeat dopplers found absent end diastolic flow. Reactive NST appropriate for 26 weeks throughout her course. She had no complaints. Reviewed case with MFM, she was felt to be stable enough home for discharge (see US dated 05/15/17) for note. She will return Monday for repeat cord dopplers. Strict labor/movement precautions given, she and her husband verbalize understanding of plan.  Discharge Exam: Temp:  [97.9 F (36.6 C)-99.2 F (37.3 C)] 97.9 F (36.6 C) (11/16 1629) Pulse Rate:  [71-87] 80 (11/16 1629) Resp:  [16-19] 16 (11/16 1629) BP: (105-125)/(59-82) 125/82 (11/16 1629) SpO2:  [99 %-100 %] 100 % (11/16 1629) PE: Please see note dated from today for physical exam  Fetal monitoring:  Twin A: FHR: 150 bpm, Variability: moderate, Accelerations: Present, Decelerations: Absent  Twin B: FHR: 155 bpm, Variability: moderate, Accelerations: Present, Decelerations: Absent Uterine activity: no contractions per hour  Significant Diagnostic Studies:  Results for orders placed or performed during the hospital encounter of 05/13/17 (from the past 168 hour(s))  CBC on admission   Collection Time: 05/13/17  3:04 PM  Result Value Ref Range   WBC 12.7 (H) 4.0 - 10.5 K/uL   RBC 4.22 3.87 - 5.11 MIL/uL   Hemoglobin 11.7 (L) 12.0 - 15.0 g/dL   HCT 78.235.4 (L) 95.636.0  - 46.0 %   MCV 83.9 78.0 - 100.0 fL   MCH 27.7 26.0 - 34.0 pg   MCHC 33.1 30.0 - 36.0 g/dL   RDW 21.313.6 08.611.5 - 57.815.5 %   Platelets 244 150 - 400 K/uL  Type and screen [facility]   Collection Time: 05/13/17  3:04 PM  Result Value Ref Range   ABO/RH(D) B POS    Antibody Screen NEG    Sample Expiration 05/16/2017   ABO/Rh   Collection Time: 05/13/17  3:04 PM  Result Value Ref Range   ABO/RH(D) B POS   Amnisure rupture of membrane (rom)not [facility]   Collection Time: 05/15/17 10:30 AM  Result Value Ref Range   Amnisure ROM NEGATIVE   Wet prep, genital   Collection Time: 05/15/17 10:45 AM  Result Value Ref Range   Yeast Wet Prep HPF POC NONE SEEN NONE SEEN   Trich, Wet Prep NONE SEEN NONE SEEN   Clue Cells Wet Prep HPF POC NONE SEEN NONE SEEN   WBC, Wet Prep HPF POC FEW (A) NONE SEEN   Sperm NONE SEEN     Discharge Condition: Stable  Disposition: home  Discharge Instructions    Fetal Kick Count:  Lie on our left side for one hour after a meal, and count the number of times your baby kicks.  If it is less than 5 times, get up, move around and drink some juice.  Repeat the test 30 minutes later.  If it is still less than 5 kicks in an hour, notify your doctor.   Complete by:  As directed  Allergies as of 05/16/2017   No Known Allergies     Medication List    TAKE these medications   aspirin EC 81 MG tablet Take 1 tablet (81 mg total) by mouth daily. Take after 12 weeks for prevention of preeclampsia later in pregnancy   Prenatal Vitamins 28-0.8 MG Tabs Take 1 tablet by mouth daily.      Follow-up Information    [facility]. Go on 05/19/2017.   Specialty:  Maternal and Fetal Medicine Why:  at 7:45 am for appointment. Contact information: [address] [id number] [address] [id number] [phone]       [facility].   Why:  If you have an emergency, [facility]. Contact information: [address] [place] [id number] [phone]          Strict precautions reviewed, patient and husband verbalize understanding.  Signed: Baldemar LenisK. Meryl Margaux Engen, M.D. Attending Obstetrician & Gynecologist, [facility], [facility]  05/16/2017, 4:56 PM \

## 2017-05-19 ENCOUNTER — Encounter (HOSPITAL_COMMUNITY): Payer: Self-pay

## 2017-05-19 ENCOUNTER — Other Ambulatory Visit: Payer: Self-pay | Admitting: Obstetrics and Gynecology

## 2017-05-19 ENCOUNTER — Other Ambulatory Visit (HOSPITAL_COMMUNITY): Payer: Self-pay | Admitting: *Deleted

## 2017-05-19 ENCOUNTER — Ambulatory Visit (HOSPITAL_COMMUNITY)
Admit: 2017-05-19 | Discharge: 2017-05-19 | Disposition: A | Discharge status: Home / Self Care | Payer: Medicaid Other | Attending: Obstetrics and Gynecology | Admitting: Obstetrics and Gynecology

## 2017-05-19 DIAGNOSIS — O283 Abnormal ultrasonic finding on antenatal screening of mother: Secondary | ICD-10-CM

## 2017-05-19 DIAGNOSIS — Z3A27 27 weeks gestation of pregnancy: Secondary | ICD-10-CM

## 2017-05-19 DIAGNOSIS — Z3A28 28 weeks gestation of pregnancy: Secondary | ICD-10-CM

## 2017-05-19 DIAGNOSIS — Z364 Encounter for antenatal screening for fetal growth retardation: Secondary | ICD-10-CM

## 2017-05-19 DIAGNOSIS — O3693X2 Maternal care for fetal problem, unspecified, third trimester, fetus 2: Secondary | ICD-10-CM

## 2017-05-19 DIAGNOSIS — O09892 Supervision of other high risk pregnancies, second trimester: Secondary | ICD-10-CM | POA: Diagnosis not present

## 2017-05-19 DIAGNOSIS — O30032 Twin pregnancy, monochorionic/diamniotic, second trimester: Secondary | ICD-10-CM

## 2017-05-19 DIAGNOSIS — O4393 Unspecified placental disorder, third trimester: Secondary | ICD-10-CM

## 2017-05-19 DIAGNOSIS — O365922 Maternal care for other known or suspected poor fetal growth, second trimester, fetus 2: Secondary | ICD-10-CM | POA: Diagnosis present

## 2017-05-19 DIAGNOSIS — O365932 Maternal care for other known or suspected poor fetal growth, third trimester, fetus 2: Secondary | ICD-10-CM

## 2017-05-19 DIAGNOSIS — O30033 Twin pregnancy, monochorionic/diamniotic, third trimester: Secondary | ICD-10-CM

## 2017-05-19 DIAGNOSIS — Z3A26 26 weeks gestation of pregnancy: Secondary | ICD-10-CM

## 2017-05-19 DIAGNOSIS — O365912 Maternal care for other known or suspected poor fetal growth, first trimester, fetus 2: Secondary | ICD-10-CM

## 2017-05-23 ENCOUNTER — Inpatient Hospital Stay (HOSPITAL_COMMUNITY)
Admission: RE | Admit: 2017-05-23 | Discharge: 2017-05-23 | Disposition: A | Discharge status: Home / Self Care | Payer: Medicaid Other | Source: Ambulatory Visit

## 2017-05-23 ENCOUNTER — Encounter (HOSPITAL_COMMUNITY): Payer: Self-pay

## 2017-05-23 ENCOUNTER — Inpatient Hospital Stay (HOSPITAL_COMMUNITY)
Admission: AD | Admit: 2017-05-23 | Discharge: 2017-05-26 | DRG: 833 | Disposition: A | Discharge status: Home / Self Care | Payer: Medicaid Other | Source: Ambulatory Visit | Attending: Obstetrics and Gynecology | Admitting: Obstetrics and Gynecology

## 2017-05-23 ENCOUNTER — Encounter (HOSPITAL_COMMUNITY): Payer: Self-pay | Admitting: *Deleted

## 2017-05-23 ENCOUNTER — Other Ambulatory Visit (HOSPITAL_COMMUNITY): Payer: Self-pay | Admitting: Maternal and Fetal Medicine

## 2017-05-23 ENCOUNTER — Other Ambulatory Visit: Payer: Self-pay

## 2017-05-23 ENCOUNTER — Ambulatory Visit (HOSPITAL_COMMUNITY)
Admission: RE | Admit: 2017-05-23 | Discharge: 2017-05-23 | Disposition: A | Discharge status: Home / Self Care | Payer: Medicaid Other | Source: Ambulatory Visit | Attending: Maternal and Fetal Medicine | Admitting: Maternal and Fetal Medicine

## 2017-05-23 DIAGNOSIS — O365922 Maternal care for other known or suspected poor fetal growth, second trimester, fetus 2: Secondary | ICD-10-CM

## 2017-05-23 DIAGNOSIS — O24414 Gestational diabetes mellitus in pregnancy, insulin controlled: Secondary | ICD-10-CM | POA: Diagnosis present

## 2017-05-23 DIAGNOSIS — O24419 Gestational diabetes mellitus in pregnancy, unspecified control: Secondary | ICD-10-CM | POA: Diagnosis not present

## 2017-05-23 DIAGNOSIS — O30033 Twin pregnancy, monochorionic/diamniotic, third trimester: Secondary | ICD-10-CM

## 2017-05-23 DIAGNOSIS — O365930 Maternal care for other known or suspected poor fetal growth, third trimester, not applicable or unspecified: Secondary | ICD-10-CM | POA: Diagnosis not present

## 2017-05-23 DIAGNOSIS — O365990 Maternal care for other known or suspected poor fetal growth, unspecified trimester, not applicable or unspecified: Secondary | ICD-10-CM

## 2017-05-23 DIAGNOSIS — Z3A27 27 weeks gestation of pregnancy: Secondary | ICD-10-CM

## 2017-05-23 DIAGNOSIS — Z3A26 26 weeks gestation of pregnancy: Secondary | ICD-10-CM

## 2017-05-23 DIAGNOSIS — Z3A28 28 weeks gestation of pregnancy: Secondary | ICD-10-CM

## 2017-05-23 DIAGNOSIS — O30032 Twin pregnancy, monochorionic/diamniotic, second trimester: Secondary | ICD-10-CM

## 2017-05-23 DIAGNOSIS — O365920 Maternal care for other known or suspected poor fetal growth, second trimester, not applicable or unspecified: Secondary | ICD-10-CM | POA: Diagnosis present

## 2017-05-23 DIAGNOSIS — O365993 Maternal care for other known or suspected poor fetal growth, unspecified trimester, fetus 3: Secondary | ICD-10-CM

## 2017-05-23 LAB — GLUCOSE TOLERANCE, 1 HOUR: GLUCOSE 1 HOUR GTT: 244 mg/dL — AB (ref 70–140)

## 2017-05-23 LAB — TYPE AND SCREEN
ABO/RH(D): B POS
ANTIBODY SCREEN: NEGATIVE

## 2017-05-23 LAB — GLUCOSE, CAPILLARY
GLUCOSE-CAPILLARY: 219 mg/dL — AB (ref 65–99)
Glucose-Capillary: 238 mg/dL — ABNORMAL HIGH (ref 65–99)

## 2017-05-23 MED ORDER — INSULIN REGULAR HUMAN 100 UNIT/ML IJ SOLN: 9.0000 [IU] | Freq: Every day | INTRAMUSCULAR | Status: DC

## 2017-05-23 MED ORDER — PRENATAL MULTIVITAMIN CH
1.0000 | ORAL_TABLET | Freq: Every day | ORAL | Status: DC
  Administered 2017-05-23 – 2017-05-25 (×3): 1 via ORAL
  Filled 2017-05-23 (×5): qty 1

## 2017-05-23 MED ORDER — INSULIN ASPART 100 UNIT/ML ~~LOC~~ SOLN
12.0000 [IU] | Freq: Every day | SUBCUTANEOUS | Status: DC
  Administered 2017-05-24 – 2017-05-26 (×3): 12 [IU] via SUBCUTANEOUS

## 2017-05-23 MED ORDER — CALCIUM CARBONATE ANTACID 500 MG PO CHEW: 2.0000 | CHEWABLE_TABLET | ORAL | Status: DC | PRN

## 2017-05-23 MED ORDER — DOCUSATE SODIUM 100 MG PO CAPS
100.0000 mg | ORAL_CAPSULE | Freq: Every day | ORAL | Status: DC
  Administered 2017-05-23 – 2017-05-26 (×3): 100 mg via ORAL
  Filled 2017-05-23 (×3): qty 1

## 2017-05-23 MED ORDER — INSULIN ASPART 100 UNIT/ML ~~LOC~~ SOLN
9.0000 [IU] | Freq: Every day | SUBCUTANEOUS | Status: DC
  Administered 2017-05-23 – 2017-05-25 (×3): 9 [IU] via SUBCUTANEOUS

## 2017-05-23 MED ORDER — ACETAMINOPHEN 325 MG PO TABS
650.0000 mg | ORAL_TABLET | ORAL | Status: DC | PRN
  Administered 2017-05-26: 650 mg via ORAL
  Filled 2017-05-23: qty 2

## 2017-05-23 MED ORDER — INSULIN NPH (HUMAN) (ISOPHANE) 100 UNIT/ML ~~LOC~~ SUSP
20.0000 [IU] | Freq: Every day | SUBCUTANEOUS | Status: DC
  Administered 2017-05-24 – 2017-05-26 (×3): 20 [IU] via SUBCUTANEOUS
  Filled 2017-05-23: qty 10

## 2017-05-23 MED ORDER — INSULIN REGULAR HUMAN 100 UNIT/ML IJ SOLN: 12.0000 [IU] | Freq: Every day | INTRAMUSCULAR | Status: DC

## 2017-05-23 MED ORDER — INSULIN NPH (HUMAN) (ISOPHANE) 100 UNIT/ML ~~LOC~~ SUSP
9.0000 [IU] | Freq: Every day | SUBCUTANEOUS | Status: DC
  Administered 2017-05-23: 9 [IU] via SUBCUTANEOUS

## 2017-05-23 NOTE — Progress Notes (Signed)
Report was called to Bixbyaitlin, Charity fundraiserN, charge nurse on High Risk OB @1215 . Patient was escorted to admission desk to be admitted then escorted to room 319 ambulatory. Caitlin, RN was at the Auto-Owners Insurancenurse's desk and was informed of patient's arrival to room.

## 2017-05-23 NOTE — Progress Notes (Signed)
Call placed to Dr. Jolayne Pantheronstant to make her aware that Patient has arrived to High [facility]. No orders received at this time.

## 2017-05-23 NOTE — Progress Notes (Signed)
Dr. Ezzard StandingNewman spoke with Dr. Jolayne Pantheronstant regarding patient's dopplers today and recommended admission. Dr. Jolayne Pantheronstant accepted patient and assumed care.

## 2017-05-23 NOTE — Addendum Note (Signed)
Encounter addended by: Arvil Personsarrera, Laini Urick F, RN on: 05/23/2017 12:35 PM  Actions taken: Sign clinical note

## 2017-05-23 NOTE — H&P (Signed)
Donna Chen is a 21 y.o. female G1P0 at 561w5d with mono/di twin presenting for MFM suite for admission secondary to reverse end diastolic flow in twin B. Patient reports doing well and is without complaints. She reports good fetal movement. She denies vaginal bleeding, leakage of fluid or contractions. Patient with prenatal care at [facility] since 14 weeks complicated by mono/di twins, fetal growth restriction in twin B  OB History    Gravida Para Term Preterm AB Living   1         0   SAB TAB Ectopic Multiple Live Births                 Past Medical History:  Diagnosis Date  . Medical history non-contributory    Past Surgical History:  Procedure Laterality Date  . NO PAST SURGERIES     Family History: family history is not on file. Social History:  reports that  has never smoked. she has never used smokeless tobacco. She reports that she does not drink alcohol or use drugs.     Maternal Diabetes: not yet tested Genetic Screening: Declined Fetal Ultrasounds or other Referrals:  Referred to Materal Fetal Medicine  Maternal Substance Abuse:  No Significant Maternal Medications:  None Significant Maternal Lab Results:  None Other Comments:  None  ROS History   Blood pressure 136/83, pulse 94, temperature 97.6 F (36.4 C), temperature source Oral, resp. rate 16, last menstrual period 11/10/2016, SpO2 100 %. Exam Physical Exam  GENERAL: Well-developed, well-nourished female in no acute distress.  LUNGS: Clear to auscultation bilaterally.  HEART: Regular rate and rhythm. ABDOMEN: Soft, nontender, gravid PELVIC: Not indicated EXTREMITIES: No cyanosis, clubbing, or edema, 2+ distal pulses.  Prenatal labs: ABO, Rh: --/--/B POS, B POS (11/13 1504) Antibody: NEG (11/13 1504) Rubella: 2.06 (08/23 1023) RPR: Non Reactive (08/23 1023)  HBsAg: Negative (08/23 1023)  HIV:   NR GBS:     [place] Mfm Ua Cord Doppler  Result Date:  05/23/2017 ----------------------------------------------------------------------  OBSTETRICS REPORT                      (Signed Final 05/23/2017 12:05 pm) ---------------------------------------------------------------------- Patient Info  ID #:       [id number]                          D.O.B.:  Jan 25, 1996 (21 yrs)  Name:       Donna Chen                    Visit Date: 05/23/2017 11:48 am ---------------------------------------------------------------------- Performed By  Performed By:     Percell BostonHeather Waken          Ref. Address:     [facility]                    [facility]                                                             [facility]  [address]                                                             [place], [place]                                                             [address]  Attending:        Charlsie Merles MD         Location:         [facility]  Referred By:      [facility] for                    [facility]                    Healthcare ---------------------------------------------------------------------- Orders   #  Description                                 Code   1  [place] MFM UA CORD DOPPLER                      [id number]   2  [place] MFM UA DOPPLER ADDL GEST RE              81191.47      EVAL  ----------------------------------------------------------------------   #  Ordered By               Order #        Accession #    Episode #   1  Particia Nearing            [id number]      [id number]     [id number]   2  MARTHA DECKER            [id number]      [id number]     [id number]  ---------------------------------------------------------------------- Indications   [redacted] weeks gestation of pregnancy                Z3A.27   Twin pregnancy, mono/di, second trimester      O30.032   Maternal care for known or suspected poor      O36.5922   fetal growth,  second trimester, fetus 2  ---------------------------------------------------------------------- OB History  Gravidity:    1         Term:   0        Prem:   0        SAB:   0  TOP:  0       Ectopic:  0        Living: 0 ---------------------------------------------------------------------- Fetal Evaluation (Fetus A)  Num Of Fetuses:     2  Cardiac Activity:   Observed  Fetal Lie:          Maternal right side  Presentation:       Cephalic  Membrane Desc:      Dividing Membrane seen  Amniotic Fluid  AFI FV:      Subjectively within normal limits                              Largest Pocket(cm)                              4.3 ---------------------------------------------------------------------- Gestational Age (Fetus A)  LMP:           27w 5d        Date:  11/10/16                 EDD:   08/17/17  Best:          27w 5d     Det. By:  LMP  (11/10/16)          EDD:   08/17/17 ---------------------------------------------------------------------- Doppler - Fetal Vessels (Fetus A)  Umbilical Artery   S/D     %tile     RI              PI              PSV    ADFV    RDFV                                                   (cm/s)  3.52       69   0.72             1.19              36.7       N       N ---------------------------------------------------------------------- Fetal Evaluation (Fetus B)  Num Of Fetuses:     2  Cardiac Activity:   Observed  Fetal Lie:          Maternal left side  Presentation:       Cephalic  Membrane Desc:      Dividing Membrane seen  Amniotic Fluid  AFI FV:      Subjectively within normal limits                              Largest Pocket(cm)                              4.5 ---------------------------------------------------------------------- Gestational Age (Fetus B)  LMP:           27w 5d        Date:  11/10/16                 EDD:   08/17/17  Best:          27w 5d     Det. By:  LMP  (  11/10/16)          EDD:   08/17/17  ---------------------------------------------------------------------- Doppler - Fetal Vessels (Fetus B)  Umbilical Artery                                                            ADFV    RDFV                                                                Y        Y ---------------------------------------------------------------------- Impression  Monochorionic/diamniotic twin pregnancy at 27+5 weeks;  twin B has fetal growth restriction and absent end distolic flow  Normal amniotic fluid volume x 2  Twin A: UA dopplers were normal for this GA  Twin B: UA dopplers show reversed end distolic flow; fetal  ductus venosus dopplers look normal ---------------------------------------------------------------------- Recommendations  Discussed with on-call faculty physician; recommend  admission with prolonged (1 hour) monitoring 2-3 times per  day and repeat dopplers on 11/26, sooner if EFM shows  deterioration  Patient has completed recent corticosteroids and  neuroprotective MgSO4 ----------------------------------------------------------------------                 Charlsie Merles, MD Electronically Signed Final Report   05/23/2017 12:05 pm ----------------------------------------------------------------------  [place] Mfm Ua Doppler Addl Gest Re Eval  Result Date: 05/23/2017 ----------------------------------------------------------------------  OBSTETRICS REPORT                      (Signed Final 05/23/2017 12:05 pm) ---------------------------------------------------------------------- Patient Info  ID #:       [id number]                          D.O.B.:  1995-10-23 (21 yrs)  Name:       Donna Chen                    Visit Date: 05/23/2017 11:48 am ---------------------------------------------------------------------- Performed By  Performed By:     Percell Boston          Ref. Address:     [facility]                    RD[facility]                                                             OB[facility]Gyn Clinic                                                              [address]                                                             [place], [place]                                                             [id number]  Attending:        Charlsie MerlesMark Newman MD         [facility]         [facility]  Referred By[facility]      [facility]                    Center for                    [facility]                    [facility] ---------------------------------------------------------------------- Orders   #  Description                                 Code   1  US MFM UA CORD DOPPLER                      [id number]   2  US MFM UA DOPPLER ADDL GEST RE              76820.03      EVAL  ----------------------------------------------------------------------   #  Ordered By               Order #        Accession #    Episode #   1  Particia NearingMARTHA DECKER            [id number]      [id number]     [id number]   2  MARTHA DECKER            [id number]      [id number]     [id number]  ---------------------------------------------------------------------- Indications   [redacted] weeks gestation of pregnancy                Z3A.27   Twin pregnancy, mono/di, second trimester      O30.032   Maternal care for known or suspected poor      O36.5922   fetal growth, second trimester, fetus 2  ---------------------------------------------------------------------- OB History  Gravidity:    1         Term:   0        Prem:   0        SAB:   0  TOP:          0       Ectopic:  0        Living: 0 ---------------------------------------------------------------------- Fetal Evaluation (Fetus A)  Num Of Fetuses:     2  Cardiac Activity:   Observed  Fetal Lie:          Maternal right side  Presentation:  Cephalic  Membrane Desc:      Dividing Membrane seen  Amniotic Fluid  AFI FV:      Subjectively within normal limits                              Largest Pocket(cm)                              4.3  ---------------------------------------------------------------------- Gestational Age (Fetus A)  LMP:           27w 5d        Date:  11/10/16                 EDD:   08/17/17  Best:          27w 5d     Det. By:  LMP  (11/10/16)          EDD:   08/17/17 ---------------------------------------------------------------------- Doppler - Fetal Vessels (Fetus A)  Umbilical Artery   S/D     %tile     RI              PI              PSV    ADFV    RDFV                                                   (cm/s)  3.52       69   0.72             1.19              36.7       N       N ---------------------------------------------------------------------- Fetal Evaluation (Fetus B)  Num Of Fetuses:     2  Cardiac Activity:   Observed  Fetal Lie:          Maternal left side  Presentation:       Cephalic  Membrane Desc:      Dividing Membrane seen  Amniotic Fluid  AFI FV:      Subjectively within normal limits                              Largest Pocket(cm)                              4.5 ---------------------------------------------------------------------- Gestational Age (Fetus B)  LMP:           27w 5d        Date:  11/10/16                 EDD:   08/17/17  Best:          27w 5d     Det. By:  LMP  (11/10/16)          EDD:   08/17/17 ---------------------------------------------------------------------- Doppler - Fetal Vessels (Fetus B)  Umbilical Artery  ADFV    RDFV                                                                Y        Y ---------------------------------------------------------------------- Impression  Monochorionic/diamniotic twin pregnancy at 27+5 weeks;  twin B has fetal growth restriction and absent end distolic flow  Normal amniotic fluid volume x 2  Twin A: UA dopplers were normal for this GA  Twin B: UA dopplers show reversed end distolic flow; fetal  ductus venosus dopplers look normal  ---------------------------------------------------------------------- Recommendations  Discussed with on-call faculty physician; recommend  admission with prolonged (1 hour) monitoring 2-3 times per  day and repeat dopplers on 11/26, sooner if EFM shows  deterioration  Patient has completed recent corticosteroids and  neuroprotective MgSO4 ----------------------------------------------------------------------                 Charlsie Merles, MD Electronically Signed Final Report   05/23/2017 12:05 pm ----------------------------------------------------------------------    Assessment/Plan: 21 yo G1P0 at [redacted]w[redacted]d with mono/di twins, twin B with FGR and absent end diastolic flow - Plan for in patient observation - Fetal monitoring  - Repeat dopplers on 11/26 - Patient completed BMZ course on 11/13-11/14 - 1 hour glucola today - Delivery for fetal indication   Donna Chen 05/23/2017, 1:49 PM

## 2017-05-24 LAB — GLUCOSE, CAPILLARY
GLUCOSE-CAPILLARY: 139 mg/dL — AB (ref 65–99)
GLUCOSE-CAPILLARY: 93 mg/dL (ref 65–99)
Glucose-Capillary: 178 mg/dL — ABNORMAL HIGH (ref 65–99)
Glucose-Capillary: 154 mg/dL — ABNORMAL HIGH (ref 65–99)

## 2017-05-24 MED ORDER — INSULIN NPH (HUMAN) (ISOPHANE) 100 UNIT/ML ~~LOC~~ SUSP
12.0000 [IU] | Freq: Every day | SUBCUTANEOUS | Status: DC
  Administered 2017-05-24 – 2017-05-25 (×2): 12 [IU] via SUBCUTANEOUS

## 2017-05-24 NOTE — Progress Notes (Signed)
Patient ID: Donna Chen, female   DOB: 05-31-1996, 21 y.o.   MRN: [id number] FACULTY PRACTICE ANTEPARTUM(COMPREHENSIVE) NOTE  Donna Chen is a 21 y.o. G1P0 with mono/di twins at 2076w6d  who is admitted for FGR and AEDF in twin B.    Fetal presentation is cephalic/cephalic. Length of Stay:  1  Days  Date of admission:05/23/2017  Subjective: Patient reports feeling well. She denies any contractions, vaginal bleeding or leakage of fluid. She reports good fetal movement Patient reports the fetal movement as active. Patient reports uterine contraction  activity as none. Patient reports  vaginal bleeding as none. Patient describes fluid per vagina as None.  Vitals:  Blood pressure 109/69, pulse 91, temperature 98 F (36.7 C), temperature source Oral, resp. rate 16, height 4\' 9"  (1.448 m), weight 134 lb 9.6 oz (61.1 kg), last menstrual period 11/10/2016, SpO2 100 %. Vitals:   05/23/17 1817 05/23/17 2000 05/23/17 2320 05/24/17 0809  BP:  114/73 108/76 109/69  Pulse:  90 95 91  Resp:  18 18 16   Temp:  98.9 F (37.2 C) 98 F (36.7 C)   TempSrc:  Oral Oral   SpO2:  100% 100% 100%  Weight: 134 lb 9.6 oz (61.1 kg)     Height: 4\' 9"  (1.448 m)      Physical Examination:  General appearance - alert, well appearing, and in no distress Fundal Height:  size greater than dates Pelvic Exam:  examination not indicated Cervical Exam: Not evaluated.  Extremities: extremities normal, atraumatic, no cyanosis or edema with DTRs 2+ bilaterally Membranes:intact  Fetal Monitoring:  Baseline: 145 x 2 bpm, Variability: moderate variability x 2, Accelerations: 10x 10 accels x 2 and Decelerations: Absent     Labs:  Results for orders placed or performed during the hospital encounter of 05/23/17 (from the past 24 hour(s))  Type and screen [facility]   Collection Time: 05/23/17  1:26 PM  Result Value Ref Range   ABO/RH(D) B POS    Antibody Screen NEG    Sample Expiration 05/26/2017    Glucose tolerance, 1 hour   Collection Time: 05/23/17  3:38 PM  Result Value Ref Range   Glucose, 1 Hour GTT 244 (H) 70 - 140 mg/dL  Glucose, capillary   Collection Time: 05/23/17  6:17 PM  Result Value Ref Range   Glucose-Capillary 238 (H) 65 - 99 mg/dL  Glucose, capillary   Collection Time: 05/23/17 11:17 PM  Result Value Ref Range   Glucose-Capillary 219 (H) 65 - 99 mg/dL  Glucose, capillary   Collection Time: 05/24/17  7:10 AM  Result Value Ref Range   Glucose-Capillary 139 (H) 65 - 99 mg/dL    Imaging Studies:     Medications:  Scheduled . docusate sodium  100 mg Oral Daily  . insulin aspart  12 Units Subcutaneous Q breakfast  . insulin aspart  9 Units Subcutaneous Q supper  . insulin NPH Human  12 Units Subcutaneous QHS  . insulin NPH Human  20 Units Subcutaneous QAC breakfast  . prenatal multivitamin  1 tablet Oral Q1200   I have reviewed the patient's current medications.  ASSESSMENT: G1P0 8876w6d Estimated Date of Delivery: 08/17/17  Patient Active Problem List   Diagnosis Date Noted  . Pregnancy affected by fetal growth restriction 05/23/2017  . [redacted] weeks gestation of pregnancy   . Abnormal fetal ultrasound   . Inter-twin discordance, antepartum 05/13/2017  . Twin pregnancy, twins discordant 05/13/2017  . Monochorionic diamniotic twin gestation in second  trimester 04/17/2017  . Pregnancy, supervision, high-risk 02/20/2017    PLAN: Moni/Di twins with twin B with FGR and AEDF and GDM - Patient completed a course of BMZ - In patient observation with close fetal monitoring - Repeat dopplers on 11/26 - Patient with GDM. Will increase insulin dosage, given fasting of 139  Donna Chen 05/24/2017,8:16 AM

## 2017-05-25 DIAGNOSIS — O30032 Twin pregnancy, monochorionic/diamniotic, second trimester: Secondary | ICD-10-CM

## 2017-05-25 DIAGNOSIS — O365930 Maternal care for other known or suspected poor fetal growth, third trimester, not applicable or unspecified: Secondary | ICD-10-CM

## 2017-05-25 LAB — GLUCOSE, CAPILLARY
GLUCOSE-CAPILLARY: 100 mg/dL — AB (ref 65–99)
GLUCOSE-CAPILLARY: 103 mg/dL — AB (ref 65–99)
GLUCOSE-CAPILLARY: 92 mg/dL (ref 65–99)
GLUCOSE-CAPILLARY: 177 mg/dL — AB (ref 65–99)
GLUCOSE-CAPILLARY: 118 mg/dL — AB (ref 65–99)

## 2017-05-25 LAB — HEMOGLOBIN A1C
Hgb A1c MFr Bld: 6.6 % — ABNORMAL HIGH (ref 4.8–5.6)
Mean Plasma Glucose: 143 mg/dL

## 2017-05-25 MED ORDER — INSULIN ASPART 100 UNIT/ML ~~LOC~~ SOLN
8.0000 [IU] | Freq: Once | SUBCUTANEOUS | Status: AC
  Administered 2017-05-25: 8 [IU] via SUBCUTANEOUS

## 2017-05-25 MED ORDER — LACTATED RINGERS IV BOLUS (SEPSIS)
1000.0000 mL | Freq: Once | INTRAVENOUS | Status: AC
  Administered 2017-05-25: 1000 mL via INTRAVENOUS

## 2017-05-25 NOTE — Progress Notes (Signed)
Patient ID: Donna Chen, female   DOB: 1996-05-13, 21 y.o.   MRN: [id number] FACULTY PRACTICE ANTEPARTUM(COMPREHENSIVE) NOTE  Donna Chen is a 21 y.o. G1P0 at 6227w0d  who is admitted for Mono/Di Twin gestation with AEDF and Intermittent REDF of baby B, with 23% growth discrepancy of twins..  Last admission was Friday when IREDF was first noted on u/s. Ductus venosus flow considered acceptable on BB B and plans are for BPP in a.m.  Fetal presentation is cephalic and cephalic. Length of Stay:  2  Days  Subjective: CBG's are improved on NPH 20am/12 pm and Novolog 9 bid, no lunch dose of novolog Patient reports the fetal movement as active. Patient reports uterine contraction  activity as none. Patient reports  vaginal bleeding as none. Patient describes fluid per vagina as None.  Vitals:  Blood pressure 113/79, pulse 100, temperature 98.5 F (36.9 C), temperature source Oral, resp. rate 16, height 4\' 9"  (1.448 m), weight 134 lb 9.6 oz (61.1 kg), last menstrual period 11/10/2016, SpO2 96 %. Physical Examination:  General appearance - alert, well appearing, and in no distress Heart - normal rate and regular rhythm Abdomen - soft, nontender, nondistended Fundal Height:  consistent with twins Cervical Exam: Not evaluated. and found to be not evaluated/ /Floating and fetal presentation is cephalic. Extremities: extremities normal, atraumatic, no cyanosis or edema and Homans sign is negative, no sign of DVT with DTRs 2+ bilaterally Membranes:intact  Fetal Monitoring:  done x 1 hour  x 3/day  Labs:  Results for orders placed or performed during the hospital encounter of 05/23/17 (from the past 24 hour(s))  Hemoglobin A1c   Collection Time: 05/24/17 11:15 AM  Result Value Ref Range   Hgb A1c MFr Bld 6.6 (H) 4.8 - 5.6 %   Mean Plasma Glucose 143 mg/dL  Glucose, capillary   Collection Time: 05/24/17 11:47 AM  Result Value Ref Range   Glucose-Capillary 93 65 - 99 mg/dL  Glucose, capillary   Collection Time: 05/24/17  3:00 PM  Result Value Ref Range   Glucose-Capillary 178 (H) 65 - 99 mg/dL  Glucose, capillary   Collection Time: 05/24/17  9:35 PM  Result Value Ref Range   Glucose-Capillary 154 (H) 65 - 99 mg/dL  Glucose, capillary   Collection Time: 05/25/17  7:03 AM  Result Value Ref Range   Glucose-Capillary 100 (H) 65 - 99 mg/dL  Glucose, capillary   Collection Time: 05/25/17  9:28 AM  Result Value Ref Range   Glucose-Capillary 103 (H) 65 - 99 mg/dL   Comment 1 Notify RN    Comment 2 Document in Chart     Imaging Studies:     Currently EPIC will not allow sonographic studies to automatically populate into notes.  In the meantime, copy and paste results into note or free text.  Medications:  Scheduled . docusate sodium  100 mg Oral Daily  . insulin aspart  12 Units Subcutaneous Q breakfast  . insulin aspart  9 Units Subcutaneous Q supper  . insulin NPH Human  12 Units Subcutaneous QHS  . insulin NPH Human  20 Units Subcutaneous QAC breakfast  . prenatal multivitamin  1 tablet Oral Q1200   I have reviewed the patient's current medications.  ASSESSMENT: Patient Active Problem List   Diagnosis Date Noted  . Pregnancy affected by fetal growth restriction 05/23/2017  . [redacted] weeks gestation of pregnancy   . Abnormal fetal ultrasound   . Inter-twin discordance, antepartum 05/13/2017  . Twin pregnancy, twins discordant  05/13/2017  . Monochorionic diamniotic twin gestation in second trimester 04/17/2017  . Pregnancy, supervision, high-risk 02/20/2017    PLAN: Inpatient care til delivery BPP by MFM tomorrow with reestablish of plan for when to intervene in pregnancy   Tilda BurrowJohn V Bryndle Corredor 05/25/2017,9:51 AM

## 2017-05-25 NOTE — Progress Notes (Signed)
Results for [name], Donna (MRN [id number]) as of 05/25/2017 18:15  Ref. Range 05/25/2017 14:31  Glucose-Capillary Latest Ref Range: 65 - 99 mg/dL 409177 (H)  Dr. Jolayne Pantheronstant notified of above value and orders received.

## 2017-05-26 ENCOUNTER — Inpatient Hospital Stay (HOSPITAL_COMMUNITY): Payer: Medicaid Other

## 2017-05-26 ENCOUNTER — Encounter: Payer: Medicaid Other | Admitting: Advanced Practice Midwife

## 2017-05-26 DIAGNOSIS — Z3A28 28 weeks gestation of pregnancy: Secondary | ICD-10-CM

## 2017-05-26 DIAGNOSIS — O365990 Maternal care for other known or suspected poor fetal growth, unspecified trimester, not applicable or unspecified: Secondary | ICD-10-CM

## 2017-05-26 LAB — GLUCOSE, CAPILLARY
GLUCOSE-CAPILLARY: 119 mg/dL — AB (ref 65–99)
Glucose-Capillary: 88 mg/dL (ref 65–99)
Glucose-Capillary: 88 mg/dL (ref 65–99)

## 2017-05-26 MED ORDER — INSULIN NPH (HUMAN) (ISOPHANE) 100 UNIT/ML ~~LOC~~ SUSP: 20.0000 [IU] | Freq: Every day | SUBCUTANEOUS | 11 refills | Status: DC

## 2017-05-26 MED ORDER — INSULIN NPH (HUMAN) (ISOPHANE) 100 UNIT/ML ~~LOC~~ SUSP: 12.0000 [IU] | Freq: Every day | SUBCUTANEOUS | 11 refills | Status: DC

## 2017-05-26 MED ORDER — INSULIN ASPART 100 UNIT/ML ~~LOC~~ SOLN: 12.0000 [IU] | Freq: Every day | SUBCUTANEOUS | 11 refills | Status: DC

## 2017-05-26 MED ORDER — INSULIN STARTER KIT- SYRINGES (ENGLISH)
1.0000 | Freq: Once | Status: AC
  Administered 2017-05-26: 1
  Filled 2017-05-26: qty 1

## 2017-05-26 MED ORDER — INSULIN ASPART 100 UNIT/ML ~~LOC~~ SOLN: 9.0000 [IU] | Freq: Every day | SUBCUTANEOUS | 11 refills | Status: DC

## 2017-05-26 NOTE — Progress Notes (Signed)
MD states no need to monitor babies before discharge. Diabetic coordinator coming to educate patient on giving herself insulin and to answer any questions before patient goes home.

## 2017-05-26 NOTE — Discharge Summary (Signed)
Physician Discharge Summary  Patient ID: Donna Chen MRN: [id number] DOB/AGE: 07/28/1995 21 y.o.  Admit date: 05/23/2017 Discharge date: 05/26/2017  Admission Diagnoses: Patient Active Problem List   Diagnosis Date Noted  . Pregnancy affected by fetal growth restriction 05/23/2017  . [redacted] weeks gestation of pregnancy   . Abnormal fetal ultrasound   . Inter-twin discordance, antepartum 05/13/2017  . Twin pregnancy, twins discordant 05/13/2017  . Monochorionic diamniotic twin gestation in second trimester 04/17/2017  . Pregnancy, supervision, high-risk 02/20/2017     Discharge Diagnoses:  Active Problems:   Pregnancy affected by fetal growth restriction Twin mono/di gestation  Discharged Condition: good  Hospital Course:  Signed            [] Hide copied text  [] Hover for details   Donna Chen is a 21 y.o. female G1P0 at [redacted]w[redacted]d with mono/di twin presenting for MFM suite for admission secondary to reverse end diastolic flow in twin B. Patient reports doing well and is without complaints. She reports good fetal movement. She denies vaginal bleeding, leakage of fluid or contractions. Patient with prenatal care at [facility] since 14 weeks complicated by mono/di twins, fetal growth restriction in twin B          OB History    Gravida Para Term Preterm AB Living   1         0   SAB TAB Ectopic Multiple Live Births                     Past Medical History:  Diagnosis Date  . Medical history non-contributory         Past Surgical History:  Procedure Laterality Date  . NO PAST SURGERIES     Family History: family history is not on file. Social History:  reports that  has never smoked. she has never used smokeless tobacco. She reports that she does not drink alcohol or use drugs.     Maternal Diabetes: not yet tested Genetic Screening: Declined Fetal Ultrasounds or other Referrals:  Referred to Materal Fetal Medicine  Maternal Substance Abuse:   No Significant Maternal Medications:  None Significant Maternal Lab Results:  None Other Comments:  None  ROS History Blood pressure 136/83, pulse 94, temperature 97.6 F (36.4 C), temperature source Oral, resp. rate 16, last menstrual period 11/10/2016, SpO2 100 %. Exam Physical Exam  GENERAL: Well-developed, well-nourished female in no acute distress.  LUNGS: Clear to auscultation bilaterally.  HEART: Regular rate and rhythm. ABDOMEN: Soft, nontender, gravid PELVIC: Not indicated EXTREMITIES: No cyanosis, clubbing, or edema, 2+ distal pulses.  Prenatal labs: ABO, Rh: --/--/B POS, B POS (11/13 1504) Antibody: NEG (11/13 1504) Rubella: 2.06 (08/23 1023) RPR: Non Reactive (08/23 1023)  HBsAg: Negative (08/23 1023)  HIV:   NR GBS:     [place] Mfm Ua Cord Doppler  Result Date: 05/23/2017 ----------------------------------------------------------------------  OBSTETRICS REPORT                      (Signed Final 05/23/2017 12:05 pm) ---------------------------------------------------------------------- Patient Info  ID #:       [id number]                          D.O.B.:  1996/05/02 (21 yrs)  Name:       Donna Chen                    Visit Date: 05/23/2017 11:48 am ---------------------------------------------------------------------- Performed  By  Performed By:     Percell Boston          Ref. Address:     [facility]                                                             OB/Gyn Clinic                                                             [address]                                                             [place], [place]                                                             [address]  Attending:        Charlsie Merles MD         Location:         [facility]  Referred By:      [facility] for                    [facility]                    Healthcare  ---------------------------------------------------------------------- Orders   #  Description                                 Code   1  [place] MFM UA CORD DOPPLER                      [id number]   2  [place] MFM UA DOPPLER ADDL GEST RE              [id number]      EVAL  ----------------------------------------------------------------------   #  Ordered By               Order #        Accession #    Episode #  1  MARTHA DECKER            [id number]      [id number]     [id number]   2  MARTHA DECKER            [id number]      [id number]     [id number]  ---------------------------------------------------------------------- Indications   [redacted] weeks gestation of pregnancy                Z3A.27   Twin pregnancy, mono/di, second trimester      O30.032   Maternal care for known or suspected poor      O36.5922   fetal growth, second trimester, fetus 2  ---------------------------------------------------------------------- OB History  Gravidity:    1         Term:   0        Prem:   0        SAB:   0  TOP:          0       Ectopic:  0        Living: 0 ---------------------------------------------------------------------- Fetal Evaluation (Fetus A)  Num Of Fetuses:     2  Cardiac Activity:   Observed  Fetal Lie:          Maternal right side  Presentation:       Cephalic  Membrane Desc:      Dividing Membrane seen  Amniotic Fluid  AFI FV:      Subjectively within normal limits                              Largest Pocket(cm)                              4.3 ---------------------------------------------------------------------- Gestational Age (Fetus A)  LMP:           27w 5d        Date:  11/10/16                 EDD:   08/17/17  Best:          27w 5d     Det. By:  LMP  (11/10/16)          EDD:   08/17/17 ---------------------------------------------------------------------- Doppler - Fetal Vessels (Fetus A)  Umbilical Artery   S/D     %tile     RI              PI              PSV    ADFV    RDFV                                                    (cm/s)  3.52       69   0.72             1.19              36.7       N       N ---------------------------------------------------------------------- Fetal Evaluation (Fetus B)  Num Of Fetuses:     2  Cardiac Activity:   Observed  Fetal Lie:  Maternal left side  Presentation:       Cephalic  Membrane Desc:      Dividing Membrane seen  Amniotic Fluid  AFI FV:      Subjectively within normal limits                              Largest Pocket(cm)                              4.5 ---------------------------------------------------------------------- Gestational Age (Fetus B)  LMP:           27w 5d        Date:  11/10/16                 EDD:   08/17/17  Best:          27w 5d     Det. By:  LMP  (11/10/16)          EDD:   08/17/17 ---------------------------------------------------------------------- Doppler - Fetal Vessels (Fetus B)  Umbilical Artery                                                            ADFV    RDFV                                                                Y        Y ---------------------------------------------------------------------- Impression  Monochorionic/diamniotic twin pregnancy at 27+5 weeks;  twin B has fetal growth restriction and absent end distolic flow  Normal amniotic fluid volume x 2  Twin A: UA dopplers were normal for this GA  Twin B: UA dopplers show reversed end distolic flow; fetal  ductus venosus dopplers look normal ---------------------------------------------------------------------- Recommendations  Discussed with on-call faculty physician; recommend  admission with prolonged (1 hour) monitoring 2-3 times per  day and repeat dopplers on 11/26, sooner if EFM shows  deterioration  Patient has completed recent corticosteroids and  neuroprotective MgSO4 ----------------------------------------------------------------------                 Charlsie Merles, MD Electronically Signed Final Report   05/23/2017 12:05 pm  ----------------------------------------------------------------------  [place] Mfm Ua Doppler Addl Gest Re Eval  Result Date: 05/23/2017 ----------------------------------------------------------------------  OBSTETRICS REPORT                      (Signed Final 05/23/2017 12:05 pm) ---------------------------------------------------------------------- Patient Info  ID #:       [id number]                          D.O.B.:  [birth year] (21 yrs)  Name:       Donna Chen                    Visit Date: 05/23/2017 11:48 am ---------------------------------------------------------------------- Performed By  Performed By:     Percell Boston          Ref.  Address:     [facility]                    [facility]                                                             OB/Gyn Clinic                                                             [address]                                                             [address]                                                             [place], [place]                                                             [phone]  Attending:        Charlsie MerlesMark Newman MD         [facility]         [facility]  Referred By[facility]      [facility]                    [facility]                    [facility]                    [facility] ---------------------------------------------------------------------- Orders   #  Description                                 Code   1  US MFM UA CORD DOPPLER                      [id number]   2  US MFM UA DOPPLER ADDL GEST RE              [id number]      EVAL  ----------------------------------------------------------------------   #  Ordered By               Order #        Accession #    Episode #   1  Particia NearingMARTHA DECKER            [id number]  [id number]     [id number]   2  MARTHA DECKER            [id number]      [id number]     [id number]  ---------------------------------------------------------------------- Indications   [redacted] weeks gestation of pregnancy                Z3A.27   Twin  pregnancy, mono/di, second trimester      O30.032   Maternal care for known or suspected poor      O36.5922   fetal growth, second trimester, fetus 2  ---------------------------------------------------------------------- OB History  Gravidity:    1         Term:   0        Prem:   0        SAB:   0  TOP:          0       Ectopic:  0        Living: 0 ---------------------------------------------------------------------- Fetal Evaluation (Fetus A)  Num Of Fetuses:     2  Cardiac Activity:   Observed  Fetal Lie:          Maternal right side  Presentation:       Cephalic  Membrane Desc:      Dividing Membrane seen  Amniotic Fluid  AFI FV:      Subjectively within normal limits                              Largest Pocket(cm)                              4.3 ---------------------------------------------------------------------- Gestational Age (Fetus A)  LMP:           27w 5d        Date:  11/10/16                 EDD:   08/17/17  Best:          27w 5d     Det. By:  LMP  (11/10/16)          EDD:   08/17/17 ---------------------------------------------------------------------- Doppler - Fetal Vessels (Fetus A)  Umbilical Artery   S/D     %tile     RI              PI              PSV    ADFV    RDFV                                                   (cm/s)  3.52       69   0.72             1.19              36.7       N       N ---------------------------------------------------------------------- Fetal Evaluation (Fetus B)  Num Of Fetuses:     2  Cardiac Activity:   Observed  Fetal Lie:          Maternal left side  Presentation:       Cephalic  Membrane Desc:  Dividing Membrane seen  Amniotic Fluid  AFI FV:      Subjectively within normal limits                              Largest Pocket(cm)                              4.5 ---------------------------------------------------------------------- Gestational Age (Fetus B)  LMP:           27w 5d        Date:  11/10/16                 EDD:   08/17/17  Best:          27w 5d      Det. By:  LMP  (11/10/16)          EDD:   08/17/17 ---------------------------------------------------------------------- Doppler - Fetal Vessels (Fetus B)  Umbilical Artery                                                            ADFV    RDFV                                                                Y        Y ---------------------------------------------------------------------- Impression  Monochorionic/diamniotic twin pregnancy at 27+5 weeks;  twin B has fetal growth restriction and absent end distolic flow  Normal amniotic fluid volume x 2  Twin A: UA dopplers were normal for this GA  Twin B: UA dopplers show reversed end distolic flow; fetal  ductus venosus dopplers look normal ---------------------------------------------------------------------- Recommendations  Discussed with on-call faculty physician; recommend  admission with prolonged (1 hour) monitoring 2-3 times per  day and repeat dopplers on 11/26, sooner if EFM shows  deterioration  Patient has completed recent corticosteroids and  neuroprotective MgSO4 ----------------------------------------------------------------------                 Charlsie MerlesMark Newman, MD Electronically Signed Final Report   05/23/2017 12:05 pm ----------------------------------------------------------------------    Assessment/Plan: 21 yo G1P0 at 5124w5d with mono/di twins, twin B with FGR and absent end diastolic flow - Plan for in patient observation - Fetal monitoring  - Repeat dopplers on 11/26 - Patient completed BMZ course on 11/13-11/14 - 1 hour glucola today - Delivery for fetal indication     Peggy Constant 05/23/2017, 1:49 PM           She had intermittent fetal monitoring with no decelerations noted. She failed 1 hr GTT with a value of 244. Insulin therapy was started and she was to see diabetes management before discharge. Insulin therapy was initiated. F/U dopplers showed normal BPP and no absent EDF on 05/26/17. Dr Sherrie Georgeecker  recommended f/u dopplers in 2 days as an outpatient.  Consults: MFM, NICU, diabetes management  Significant Diagnostic Studies: radiology: Ultrasound: dopplers and BPP  Treatments: IV hydration  Discharge Exam: Blood pressure 113/72, pulse 87, temperature 98.4 F (36.9  C), temperature source Oral, resp. rate 16, height 4\' 9"  (1.448 m), weight 134 lb 9.6 oz (61.1 kg), last menstrual period 11/10/2016, SpO2 100 %. General appearance: alert, cooperative and no distress Resp: normal effort gravid  Disposition: 01-Home or Self Care   Allergies as of 05/26/2017   No Known Allergies     Medication List    TAKE these medications   aspirin EC 81 MG tablet Take 1 tablet (81 mg total) by mouth daily. Take after 12 weeks for prevention of preeclampsia later in pregnancy   insulin aspart 100 UNIT/ML injection Commonly known as:  novoLOG Inject 9 Units into the skin daily with supper.   insulin aspart 100 UNIT/ML injection Commonly known as:  novoLOG Inject 12 Units into the skin daily with breakfast. Start taking on:  05/27/2017   insulin NPH Human 100 UNIT/ML injection Commonly known as:  HUMULIN N,NOVOLIN N Inject 0.12 mLs (12 Units total) into the skin at bedtime.   insulin NPH Human 100 UNIT/ML injection Commonly known as:  HUMULIN N,NOVOLIN N Inject 0.2 mLs (20 Units total) into the skin daily before breakfast. Start taking on:  05/27/2017   Prenatal Vitamins 28-0.8 MG Tabs Take 1 tablet by mouth daily.      Follow-up Information    [facility] for [facility] Follow up in 1 week(s).   Specialty:  Obstetrics and Gynecology Contact information: [address] [place] [address] [phone]       [facility] Follow up on 05/28/2017.   Specialty:  Radiology Why:  0745 as scheduled in [facility] Contact information: [address] [id number] mc [place] [address] [phone]            Signed: Scheryl Darter 05/26/2017, 3:14 PM

## 2017-05-26 NOTE — Discharge Instructions (Signed)
Intrauterine Growth Restriction Intrauterine growth restriction (IUGR) is when your baby is not growing normally during your pregnancy. A baby with IUGR is smaller than it should be and may weigh less than normal at birth. IUGR can result if there is a problem with the organ that supplies your baby with oxygen and nutrition (placenta). Usually, there is no way to prevent this type of problem. What are the causes? The most common cause of IUGR is a problem with the placenta or umbilical cord that causes your developing baby to get less oxygen or nutrition than needed. Other causes include:  Poor maternal nutrition.  Chemicals found in substances such as cigarettes, alcohol, and some illegal drugs.  Some prescription medicines.  Congenital defects.  Genetic disorders.  An infection.  Carrying more than one baby, such as twins or triplets (multiple gestations).  What increases the risk? This condition is more likely to develop in women who:  Are over the age of 35 at the time of delivery (advanced maternal age).  Have medical conditions such as high blood pressure, diabetes, heart or kidney disease, anemia, or conditions that increase the risk for blood clotting.  Live at a very high altitude during pregnancy.  Have a personal history or family history of IUGR.  Take medicines during pregnancy that are linked with congenital disabilities.  Have a personal or family history of a genetic disorder.  Come into contact with infected cat feces (toxoplasmosis).  Come into contact with chickenpox (varicella) or German measles (rubella).  Have or are at risk of getting an infectious disease such as syphilis, HIV, or herpes.  Eat poorly during their pregnancy.  Weigh less than 100 pounds.  Have a family history of multiple gestations.  Have had infertility treatments.  Use tobacco, illegal drugs, or drink alcohol during pregnancy.  What are the signs or symptoms? IUGR does not  cause many symptoms. You might notice that your baby does not move or kick very often. Also, your belly may not be as big as expected for the stage of your pregnancy. How is this diagnosed? This condition is diagnosed with physical and prenatal exams. Your health care provider will measure the size of your baby inside your womb during a routine screening exam using sound waves (ultrasound). Your health care provider will compare the size of your baby to the size of other babies at the same stage of development (gestational age). Your health care provider will diagnose IUGR if your baby is smaller than 90 percent of all other babies at the same gestational age. You may also have tests to find the cause of IUGR. These can include:  Having fluid removed from your womb to check for signs of infection or a congenital disability (amniocentesis).  A series of tests to monitor your baby's health (fetal monitoring).  How is this treated? In most cases, treatment for this condition focuses on stopping the cause of your baby's small growth. Your health care providers will monitor your pregnancy closely and help you manage your pregnancy. If this condition is caused by a placenta problem and the baby is not getting enough blood, treatment may include:  Medicine to start labor and deliver your baby early (induction).  Cesarean delivery. In this procedure, your baby is delivered through a cut (incision) in the abdomen and womb (uterus).  Follow these instructions at home:  Make sure you are eating enough calories and gaining enough weight.  Eat a balanced diet. Work with a nutrition specialist (  dietitian), if needed.  Rest as needed. Try to get at least eight hours of sleep every night.  Do not drink alcohol.  Do not use illegal drugs.  Do not use any tobacco products, including cigarettes, chewing tobacco, or e-cigarettes. If you need help quitting, ask your health care provider.  Talk to your  health care provider about steps you can take to avoid infections.  Take medicines, vitamins, and mineral supplements only as directed by your health care provider. Make sure that your health care provider knows about all of the prescribed or over-the-counter medicines, supplements, vitamins, eye drops, and creams that you are using.  Keep all follow-up visits as directed by your health care provider. This is important. Contact a health care provider if:  Your baby is not moving as often as before. This information is not intended to replace advice given to you by your health care provider. Make sure you discuss any questions you have with your health care provider. Document Released: 03/26/2008 Document Revised: 11/23/2015 Document Reviewed: 06/13/2014 Elsevier Interactive Patient Education  2018 Elsevier Inc.  

## 2017-05-26 NOTE — Progress Notes (Signed)
Patient ID: Donna Chen, female   DOB: 1995-10-28, 21 y.o.   MRN: [id number]  FACULTY PRACTICE ANTEPARTUM(COMPREHENSIVE) NOTE  Donna Bellowsarita Birchler is a 21 y.o. G1P0 at 8888w1d by  who is admitted for IUGR and abnormal doppler studies with twins.   Fetal presentation is unsure. Length of Stay:  3  Days  Subjective:  Patient reports the fetal movement as active. Patient reports uterine contraction  activity as none. Patient reports  vaginal bleeding as none. Patient describes fluid per vagina as None.  Vitals:  Blood pressure 112/70, pulse 96, temperature 98.1 F (36.7 C), temperature source Oral, resp. rate 16, height 4\' 9"  (1.448 m), weight 134 lb 9.6 oz (61.1 kg), last menstrual period 11/10/2016, SpO2 100 %. Physical Examination:  General appearance - alert, well appearing, and in no distress Heart - normal rate and regular rhythm Abdomen - soft, nontender, gravid Fundal Height:  consistent with twins Cervical Exam: Not evaluated. Extremities: extremities normal, atraumatic, no cyanosis or edema and Homans sign is negative,  Membranes:intact  Fetal Monitoring:   Fetal Heart Rate A  Mode External  [removed] filed at 05/26/2017 0912  Baseline Rate (A) 145 bpm filed at 05/26/2017 0912  Variability 6-25 BPM filed at 05/26/2017 0912  Accelerations 10 x 10, 15 x 15 filed at 05/26/2017 0912  Decelerations None filed at 05/26/2017 [id number]  Multiple birth? Y filed at 05/26/2017 0912  Fetal Heart Rate Fetus B  Mode External filed at 05/26/2017 0912  Baseline Rate (B) 135 BPM filed at 05/26/2017 0912  Variability 6-25 BPM filed at 05/26/2017 0912  Accelerations 15 x 15 filed at 05/25/2017 2317  Decelerations None filed at 05/25/2017 2317     Labs:  Results for orders placed or performed during the hospital encounter of 05/23/17 (from the past 24 hour(s))  Glucose, capillary   Collection Time: 05/25/17 11:38 AM  Result Value Ref Range   Glucose-Capillary 92 65 - 99 mg/dL   Comment 1 Notify  RN    Comment 2 Document in Chart   Glucose, capillary   Collection Time: 05/25/17  2:31 PM  Result Value Ref Range   Glucose-Capillary 177 (H) 65 - 99 mg/dL  Glucose, capillary   Collection Time: 05/25/17 10:07 PM  Result Value Ref Range   Glucose-Capillary 118 (H) 65 - 99 mg/dL  Glucose, capillary   Collection Time: 05/26/17  7:46 AM  Result Value Ref Range   Glucose-Capillary 88 65 - 99 mg/dL   Comment 1 Notify RN    Comment 2 Document in Chart   Glucose, capillary   Collection Time: 05/26/17  9:26 AM  Result Value Ref Range   Glucose-Capillary 88 65 - 99 mg/dL     Medications:  Scheduled . docusate sodium  100 mg Oral Daily  . insulin aspart  12 Units Subcutaneous Q breakfast  . insulin aspart  9 Units Subcutaneous Q supper  . insulin NPH Human  12 Units Subcutaneous QHS  . insulin NPH Human  20 Units Subcutaneous QAC breakfast  . prenatal multivitamin  1 tablet Oral Q1200   I have reviewed the patient's current medications.  ASSESSMENT: Patient Active Problem List   Diagnosis Date Noted  . Pregnancy affected by fetal growth restriction 05/23/2017  . [redacted] weeks gestation of pregnancy   . Abnormal fetal ultrasound   . Inter-twin discordance, antepartum 05/13/2017  . Twin pregnancy, twins discordant 05/13/2017  . Monochorionic diamniotic twin gestation in second trimester 04/17/2017  . Pregnancy, supervision, high-risk 02/20/2017  PLAN: F/u on today's [place] study, continue intermittent fetal monitoring   Scheryl DarterJames Sadonna Kotara 05/26/2017,9:32 AM

## 2017-05-26 NOTE — Progress Notes (Signed)
Tylenol given at discharge for headache. Discharge teaching complete with pt. Pt understood different types of insulin and the correct doses. Pt discharged home to family.

## 2017-05-26 NOTE — Progress Notes (Signed)
Spoke with patient @ bedside and instructed on insulin administration. Patient returned demonstration of insulin administration accurately. Answered questions that patient had regarding insulin. Patient prefers to give NPH and Novolog meal coverage separately in the morning. Discussed meal times and patient plans to have breakfast around 9 am. Explained difference in NPH and Novolog regarding meal coverage. Instructed on hypoglycemia with treatment. Gave patient booklet on gestational diabetes. Received insulin starter kit of insulin syringes. Also spoke with patient about the importance of glycemic control.  Thank you, Nani Gasser. Emanuelle Hammerstrom, RN, MSN, CDE  Diabetes Coordinator Inpatient Glycemic Control Team Team Pager [phone] (8am-5pm) 05/26/2017 4:26 PM

## 2017-05-27 ENCOUNTER — Inpatient Hospital Stay (HOSPITAL_COMMUNITY): Admission: RE | Admit: 2017-05-27 | Payer: Medicaid Other | Source: Ambulatory Visit

## 2017-05-28 ENCOUNTER — Ambulatory Visit (HOSPITAL_COMMUNITY)
Admit: 2017-05-28 | Discharge: 2017-05-28 | Disposition: A | Discharge status: Home / Self Care | Payer: Medicaid Other | Attending: Maternal and Fetal Medicine | Admitting: Maternal and Fetal Medicine

## 2017-05-28 ENCOUNTER — Inpatient Hospital Stay (HOSPITAL_COMMUNITY)
Admission: AD | Admit: 2017-05-28 | Discharge: 2017-06-06 | DRG: 833 | Disposition: A | Discharge status: Other Acute Inpatient Hospital | Payer: Medicaid Other | Source: Ambulatory Visit | Attending: Obstetrics & Gynecology | Admitting: Obstetrics & Gynecology

## 2017-05-28 ENCOUNTER — Other Ambulatory Visit: Payer: Self-pay

## 2017-05-28 ENCOUNTER — Encounter: Payer: Medicaid Other | Admitting: Nurse Practitioner

## 2017-05-28 ENCOUNTER — Encounter (HOSPITAL_COMMUNITY): Payer: Self-pay

## 2017-05-28 DIAGNOSIS — O30033 Twin pregnancy, monochorionic/diamniotic, third trimester: Secondary | ICD-10-CM | POA: Diagnosis not present

## 2017-05-28 DIAGNOSIS — Z3A39 39 weeks gestation of pregnancy: Secondary | ICD-10-CM | POA: Diagnosis not present

## 2017-05-28 DIAGNOSIS — O365931 Maternal care for other known or suspected poor fetal growth, third trimester, fetus 1: Principal | ICD-10-CM | POA: Diagnosis present

## 2017-05-28 DIAGNOSIS — Z3A28 28 weeks gestation of pregnancy: Secondary | ICD-10-CM

## 2017-05-28 DIAGNOSIS — Z3A29 29 weeks gestation of pregnancy: Secondary | ICD-10-CM | POA: Diagnosis not present

## 2017-05-28 DIAGNOSIS — O24419 Gestational diabetes mellitus in pregnancy, unspecified control: Secondary | ICD-10-CM

## 2017-05-28 DIAGNOSIS — O30009 Twin pregnancy, unspecified number of placenta and unspecified number of amniotic sacs, unspecified trimester: Secondary | ICD-10-CM | POA: Diagnosis present

## 2017-05-28 DIAGNOSIS — O365922 Maternal care for other known or suspected poor fetal growth, second trimester, fetus 2: Secondary | ICD-10-CM

## 2017-05-28 DIAGNOSIS — O365930 Maternal care for other known or suspected poor fetal growth, third trimester, not applicable or unspecified: Secondary | ICD-10-CM

## 2017-05-28 DIAGNOSIS — O24414 Gestational diabetes mellitus in pregnancy, insulin controlled: Secondary | ICD-10-CM | POA: Diagnosis present

## 2017-05-28 DIAGNOSIS — O365932 Maternal care for other known or suspected poor fetal growth, third trimester, fetus 2: Secondary | ICD-10-CM | POA: Diagnosis present

## 2017-05-28 DIAGNOSIS — O365990 Maternal care for other known or suspected poor fetal growth, unspecified trimester, not applicable or unspecified: Secondary | ICD-10-CM | POA: Diagnosis present

## 2017-05-28 DIAGNOSIS — O365939 Maternal care for other known or suspected poor fetal growth, third trimester, other fetus: Secondary | ICD-10-CM

## 2017-05-28 DIAGNOSIS — O3690X2 Maternal care for fetal problem, unspecified, unspecified trimester, fetus 2: Secondary | ICD-10-CM

## 2017-05-28 DIAGNOSIS — O3690X0 Maternal care for fetal problem, unspecified, unspecified trimester, not applicable or unspecified: Secondary | ICD-10-CM

## 2017-05-28 DIAGNOSIS — O30003 Twin pregnancy, unspecified number of placenta and unspecified number of amniotic sacs, third trimester: Secondary | ICD-10-CM | POA: Diagnosis not present

## 2017-05-28 LAB — CBC
HEMATOCRIT: 34.2 % — AB (ref 36.0–46.0)
HEMOGLOBIN: 10.9 g/dL — AB (ref 12.0–15.0)
MCH: 27.5 pg (ref 26.0–34.0)
MCHC: 31.9 g/dL (ref 30.0–36.0)
MCV: 86.1 fL (ref 78.0–100.0)
Platelets: 161 10*3/uL (ref 150–400)
RBC: 3.97 MIL/uL (ref 3.87–5.11)
RDW: 13.7 % (ref 11.5–15.5)
WBC: 9.7 10*3/uL (ref 4.0–10.5)

## 2017-05-28 LAB — URINALYSIS, ROUTINE W REFLEX MICROSCOPIC
Bilirubin Urine: NEGATIVE
Glucose, UA: NEGATIVE mg/dL
Hgb urine dipstick: NEGATIVE
KETONES UR: NEGATIVE mg/dL
LEUKOCYTES UA: NEGATIVE
NITRITE: NEGATIVE
PH: 7 (ref 5.0–8.0)
Protein, ur: NEGATIVE mg/dL
SPECIFIC GRAVITY, URINE: 1.008 (ref 1.005–1.030)

## 2017-05-28 LAB — TYPE AND SCREEN
ABO/RH(D): B POS
ABO/RH(D): B POS
ANTIBODY SCREEN: NEGATIVE
Antibody Screen: NEGATIVE

## 2017-05-28 LAB — GLUCOSE, CAPILLARY
GLUCOSE-CAPILLARY: 79 mg/dL (ref 65–99)
GLUCOSE-CAPILLARY: 98 mg/dL (ref 65–99)
GLUCOSE-CAPILLARY: 100 mg/dL — AB (ref 65–99)
GLUCOSE-CAPILLARY: 113 mg/dL — AB (ref 65–99)
Glucose-Capillary: 93 mg/dL (ref 65–99)
Glucose-Capillary: 87 mg/dL (ref 65–99)
Glucose-Capillary: 86 mg/dL (ref 65–99)
Glucose-Capillary: 93 mg/dL (ref 65–99)

## 2017-05-28 MED ORDER — BETAMETHASONE SOD PHOS & ACET 6 (3-3) MG/ML IJ SUSP
12.0000 mg | INTRAMUSCULAR | Status: AC
  Administered 2017-05-28 – 2017-05-29 (×2): 12 mg via INTRAMUSCULAR
  Filled 2017-05-28 (×2): qty 2

## 2017-05-28 MED ORDER — MAGNESIUM SULFATE BOLUS VIA INFUSION
4.0000 g | Freq: Once | INTRAVENOUS | Status: AC
  Administered 2017-05-28: 4 g via INTRAVENOUS
  Filled 2017-05-28: qty 500

## 2017-05-28 MED ORDER — DEXTROSE-NACL 5-0.45 % IV SOLN
INTRAVENOUS | Status: DC
  Administered 2017-05-28 – 2017-05-29 (×2): via INTRAVENOUS

## 2017-05-28 MED ORDER — SODIUM CHLORIDE 0.9 % IV SOLN
INTRAVENOUS | Status: DC
  Administered 2017-05-28: 0.3 [IU]/h via INTRAVENOUS
  Administered 2017-05-29: 1 [IU]/h via INTRAVENOUS
  Administered 2017-05-30: 0.8 [IU]/h via INTRAVENOUS
  Filled 2017-05-28 (×3): qty 1

## 2017-05-28 MED ORDER — DEXTROSE 50 % IV SOLN: 25.0000 mL | INTRAVENOUS | Status: DC | PRN

## 2017-05-28 MED ORDER — ZOLPIDEM TARTRATE 5 MG PO TABS: 5.0000 mg | ORAL_TABLET | Freq: Every evening | ORAL | Status: DC | PRN

## 2017-05-28 MED ORDER — INSULIN REGULAR BOLUS VIA INFUSION
0.0000 [IU] | Freq: Three times a day (TID) | INTRAVENOUS | Status: DC
  Administered 2017-05-29: 1 [IU] via INTRAVENOUS
  Administered 2017-05-29: 3.2 [IU] via INTRAVENOUS
  Administered 2017-05-30: 0.6 [IU] via INTRAVENOUS
  Administered 2017-05-30: 2.8 [IU] via INTRAVENOUS
  Filled 2017-05-28: qty 10

## 2017-05-28 MED ORDER — CALCIUM CARBONATE ANTACID 500 MG PO CHEW: 2.0000 | CHEWABLE_TABLET | ORAL | Status: DC | PRN

## 2017-05-28 MED ORDER — LACTATED RINGERS IV SOLN
INTRAVENOUS | Status: DC
  Administered 2017-05-28: 14:00:00 via INTRAVENOUS

## 2017-05-28 MED ORDER — DOCUSATE SODIUM 100 MG PO CAPS: 100.0000 mg | ORAL_CAPSULE | Freq: Every day | ORAL | Status: DC

## 2017-05-28 MED ORDER — MAGNESIUM SULFATE 40 G IN LACTATED RINGERS - SIMPLE
2.0000 g/h | INTRAVENOUS | Status: AC
  Administered 2017-05-28: 2 g/h via INTRAVENOUS
  Filled 2017-05-28: qty 40

## 2017-05-28 MED ORDER — DOCUSATE SODIUM 100 MG PO CAPS
100.0000 mg | ORAL_CAPSULE | Freq: Every day | ORAL | Status: DC
  Administered 2017-05-29 – 2017-06-06 (×8): 100 mg via ORAL
  Filled 2017-05-28 (×10): qty 1

## 2017-05-28 MED ORDER — ACETAMINOPHEN 325 MG PO TABS: 650.0000 mg | ORAL_TABLET | ORAL | Status: DC | PRN

## 2017-05-28 MED ORDER — SODIUM CHLORIDE 0.9 % IV SOLN: INTRAVENOUS | Status: DC

## 2017-05-28 MED ORDER — PRENATAL MULTIVITAMIN CH
1.0000 | ORAL_TABLET | Freq: Every day | ORAL | Status: DC
  Administered 2017-05-29 – 2017-06-06 (×9): 1 via ORAL
  Filled 2017-05-28 (×10): qty 1

## 2017-05-28 MED ORDER — PRENATAL MULTIVITAMIN CH: 1.0000 | ORAL_TABLET | Freq: Every day | ORAL | Status: DC

## 2017-05-28 NOTE — H&P (Signed)
[facility] Antenatal History and Physical  Janece Laidlaw FAO:[id number] DOB: 09/06/1995 DOA: 05/28/2017  Chief Complaint: abnormal fetal growth and dopplers  HPI: Micalah Cabezas is a 21 y.o. female G1P0 with IUP at [redacted]w[redacted]d presenting for observation due to discordant twin growth and abnormal cord dopplers at [redacted]w[redacted]d She had been discharged 2 days ago when doppler showed no reverse flow for twin B. Repeat study today was abnormal and  Dr. Marjo Bicker recommended hospitalization, repeat betamethasone course, magnesium for CP prophylaxis and continuous monitoring.  Review of Systems:   Pt complains of pressure.  Pt denies any contractions.  Review of systems are otherwise negative  Prenatal History/Complications: Patient Active Problem List   Diagnosis Date Noted  . Twin gestation in third trimester 05/28/2017  . Pregnancy affected by fetal growth restriction 05/23/2017  . [redacted] weeks gestation of pregnancy   . Abnormal fetal ultrasound   . Inter-twin discordance, antepartum 05/13/2017  . Twin pregnancy, twins discordant 05/13/2017  . Monochorionic diamniotic twin gestation in second trimester 04/17/2017  . Pregnancy, supervision, high-risk 02/20/2017     Past Medical History: Past Medical History:  Diagnosis Date  . Medical history non-contributory     Past Surgical History: Past Surgical History:  Procedure Laterality Date  . NO PAST SURGERIES      Obstetrical History: OB History    Gravida Para Term Preterm AB Living   1         0   SAB TAB Ectopic Multiple Live Births                  Gynecological History: Pertinent Gynecological History: no   Social History: Social History   Socioeconomic History  . Marital status: Married    Spouse name: Not on file  . Number of children: Not on file  . Years of education: Not on file  . Highest education level: Not on file  Social Needs  . Financial resource strain: Not on file  . Food insecurity - worry: Not on file  .  Food insecurity - inability: Not on file  . Transportation needs - medical: Not on file  . Transportation needs - non-medical: Not on file  Occupational History  . Not on file  Tobacco Use  . Smoking status: Never Smoker  . Smokeless tobacco: Never Used  Substance and Sexual Activity  . Alcohol use: No  . Drug use: No  . Sexual activity: Yes    Birth control/protection: None  Other Topics Concern  . Not on file  Social History Narrative  . Not on file    Family History: No family history on file.  Allergies: No Known Allergies  Medications Prior to Admission  Medication Sig Dispense Refill Last Dose  . aspirin EC 81 MG tablet Take 1 tablet (81 mg total) by mouth daily. Take after 12 weeks for prevention of preeclampsia later in pregnancy 300 tablet 2 Taking  . insulin aspart (NOVOLOG) 100 UNIT/ML injection Inject 12 Units into the skin daily with breakfast. 10 mL 11 Taking  . insulin aspart (NOVOLOG) 100 UNIT/ML injection Inject 9 Units into the skin daily with supper. 10 mL 11 Taking  . insulin NPH Human (HUMULIN N,NOVOLIN N) 100 UNIT/ML injection Inject 0.2 mLs (20 Units total) into the skin daily before breakfast. 10 mL 11 Taking  . insulin NPH Human (HUMULIN N,NOVOLIN N) 100 UNIT/ML injection Inject 0.12 mLs (12 Units total) into the skin at bedtime. 10 mL 11 Taking  . Prenatal Vit-Fe Fumarate-FA (PRENATAL  VITAMINS) 28-0.8 MG TABS Take 1 tablet by mouth daily. 60 tablet 3 Taking    Physical Exam: BP 112/79 (BP Location: Left Arm)   Pulse 92   Temp 98.2 F (36.8 C) (Oral)   Ht 4\' 9"  (1.448 m)   Wt 60.3 kg (133 lb)   LMP 11/10/2016 (Exact Date)   SpO2 98%   BMI 28.78 kg/m   BP 112/79 (BP Location: Left Arm)   Pulse 92   Temp 98.2 F (36.8 C) (Oral)   Ht 4\' 9"  (1.448 m)   Wt 60.3 kg (133 lb)   LMP 11/10/2016 (Exact Date)   SpO2 98%   BMI 28.78 kg/m  General appearance: alert, cooperative and no distress Lungs: normal effort Abdomen: gravid c/w  twins Extremities: extremities normal, atraumatic, no cyanosis or edema Skin: Skin color, texture, turgor normal. No rashes or lesions cephalic and /transverse               Labs on Admission:  Basic Metabolic Panel: No results for input(s): NA, K, CL, CO2, GLUCOSE, BUN, CREATININE, CALCIUM, MG, PHOS in the last 168 hours. Liver Function Tests: No results for input(s): AST, ALT, ALKPHOS, BILITOT, PROT, ALBUMIN in the last 168 hours. No results for input(s): LIPASE, AMYLASE in the last 168 hours. No results for input(s): AMMONIA in the last 168 hours. CBC: No results for input(s): WBC, NEUTROABS, HGB, HCT, MCV, PLT in the last 168 hours.  CBG: Recent Labs  Lab 05/25/17 1431 05/25/17 2207 05/26/17 0746 05/26/17 0926 05/26/17 1130  GLUCAP 177* 118* 88 88 119*    Radiological Exams on Admission: [place] Mfm Fetal Bpp Wo Non Stress  Result Date: 05/28/2017 ----------------------------------------------------------------------  OBSTETRICS REPORT                      (Signed Final 05/28/2017 10:47 am) ---------------------------------------------------------------------- Patient Info  ID #:       [id number]                          D.O.B.:  [birth year] (21 yrs)  Name:       [name]                    Visit Date: 05/28/2017 08:15 am ---------------------------------------------------------------------- Performed By  Performed By:     Earley Brooke     Ref. Address:     [facility]                                                             OB/Gyn Clinic                                                             [address]                                                             [place], [place]                                                             [address]  Attending:        Durwin Nora       Location:         [facility]                    MD  Referred By:      [facility] for                    [facility]                    Healthcare ---------------------------------------------------------------------- Orders   #  Description                                 Code   1  [place] MFM FETAL BPP WO NON STRESS              60454.09   2  [place] MFM UA CORD DOPPLER                      76820.02   3  [place] MFM FETAL BPP WO NST ADDL                81191.4      GESTATION   4  [place] MFM UA DOPPLER ADDL GEST RE              76820.03      EVAL   5  [place] MFM OB FOLLOW UP                         76816.01   6  [place] MFM OB FOLLOW UP ADDL GEST               78295.62  ----------------------------------------------------------------------   #  Ordered By               Order #        Accession #    Episode #   1  Particia Nearing            [id number]      [id number]     [id number]   2  MARTHA DECKER            [id number]      [id number]     [id number]   3  MARTHA DECKER            [id number]      [id number]     [id number]   4  MARTHA DECKER  [id number]      [id number]     [id number]   5  MARTHA DECKER            [id number]      [id number]     [id number]   6  MARTHA DECKER            [id number]      [id number]     [id number]  ---------------------------------------------------------------------- Indications   [redacted] weeks gestation of pregnancy                Z3A.28   Twin pregnancy, mono/di, second trimester      O30.032   Maternal care for known or suspected poor      O36.5922   fetal growth, second trimester, fetus 2   Absent end diastolic flow (Fetal and           O36.90X0   placental problem in pregnancy) Twin B  ---------------------------------------------------------------------- OB History  Gravidity:    1         Term:   0        Prem:   0        SAB:   0  TOP:          0       Ectopic:  0        Living: 0 ---------------------------------------------------------------------- Fetal Evaluation (Fetus A)  Num Of Fetuses:     2  Fetal Heart         130  Rate(bpm):  Cardiac Activity:   Observed   Fetal Lie:          Maternal left side  Presentation:       Cephalic  Placenta:           Anterior, above cervical os  P. Cord Insertion:  Previously Visualized  Membrane Desc:      Dividing Membrane seen - Monochorionic  Amniotic Fluid  AFI FV:      Subjectively within normal limits                              Largest Pocket(cm)                              4.6 ---------------------------------------------------------------------- Biophysical Evaluation (Fetus A)  Amniotic F.V:   Pocket => 2 cm two         F. Tone:        Observed                  planes  F. Movement:    Observed                   Score:          8/8  F. Breathing:   Observed ---------------------------------------------------------------------- Biometry (Fetus A)  BPD:      70.9  mm     G. Age:  28w 3d         39  %    CI:        79.01   %    70 - 86  FL/HC:      19.9   %    18.8 - 20.6  HC:      252.2  mm     G. Age:  27w 3d          4  %    HC/AC:      1.00        1.05 - 1.21  AC:       251   mm     G. Age:  29w 2d         69  %    FL/BPD:     70.8   %    71 - 87  FL:       50.2  mm     G. Age:  27w 0d          7  %    FL/AC:      20.0   %    20 - 24  HUM:      47.1  mm     G. Age:  27w 5d         30  %  Est. FW:    1205  gm    2 lb 11 oz      48  %     FW Discordancy     0 \ 26 % ---------------------------------------------------------------------- Gestational Age (Fetus A)  LMP:           28w 3d        Date:  11/10/16                 EDD:   08/17/17  U/S Today:     28w 0d                                        EDD:   08/20/17  Best:          28w 3d     Det. By:  LMP  (11/10/16)          EDD:   08/17/17 ---------------------------------------------------------------------- Anatomy (Fetus A)  Cranium:               Appears normal         Aortic Arch:            Previously seen  Cavum:                 Previously seen        Ductal Arch:            Previously seen  Ventricles:             Appears normal         Diaphragm:              Appears normal  Choroid Plexus:        Previously seen        Stomach:                Appears normal, left  sided  Cerebellum:            Previously seen        Abdomen:                Appears normal  Posterior Fossa:       Previously seen        Abdominal Wall:         Previously seen  Nuchal Fold:           Previously seen        Cord Vessels:           Previously seen  Face:                  Orbits and profile     Kidneys:                Appear normal                         previously seen  Lips:                  Previously seen        Bladder:                Appears normal  Thoracic:              Appears normal         Spine:                  Previously seen  Heart:                 Appears normal         Upper Extremities:      Previously seen                         (4CH, axis, and situs  RVOT:                  Previously seen        Lower Extremities:      Previously seen  LVOT:                  Previously seen ---------------------------------------------------------------------- Doppler - Fetal Vessels (Fetus A)  Umbilical Artery   S/D     %tile                                     PSV    ADFV    RDFV                                                   (cm/s)  3.23       60                                     60.72      No      No ---------------------------------------------------------------------- Fetal Evaluation (Fetus B)  Num Of Fetuses:     2  Fetal Heart         149  Rate(bpm):  Cardiac Activity:   Observed  Presentation:  Transverse, head to maternal left  Placenta:           Anterior, above cervical os  P. Cord Insertion:  Previously Visualized  Membrane Desc:      Dividing Membrane seen - Monochorionic  Amniotic Fluid  AFI FV:      Subjectively within normal limits                              Largest Pocket(cm)                              4.7  ---------------------------------------------------------------------- Biophysical Evaluation (Fetus B)  Amniotic F.V:   Pocket => 2 cm two         F. Tone:        Observed                  planes  F. Movement:    Observed                   Score:          8/8  F. Breathing:   Observed ---------------------------------------------------------------------- Biometry (Fetus B)  BPD:      64.4  mm     G. Age:  26w 0d          1  %    CI:        73.16   %    70 - 86                                                          FL/HC:      19.9   %    18.8 - 20.6  HC:      239.3  mm     G. Age:  26w 0d        < 3  %    HC/AC:      1.10        1.05 - 1.21  AC:       218   mm     G. Age:  26w 2d        < 3  %    FL/BPD:     73.9   %    71 - 87  FL:       47.6  mm     G. Age:  26w 0d        < 3  %    FL/AC:      21.8   %    20 - 24  HUM:      43.3  mm     G. Age:  25w 6d        < 5  %  Est. FW:     892  gm    1 lb 15 oz      15  %     FW Discordancy        26  % ---------------------------------------------------------------------- Gestational Age (Fetus B)  LMP:           28w 3d        Date:  11/10/16  EDD:   08/17/17  U/S Today:     26w 1d                                        EDD:   09/02/17  Best:          28w 3d     Det. By:  LMP  (11/10/16)          EDD:   08/17/17 ---------------------------------------------------------------------- Anatomy (Fetus B)  Cranium:               Appears normal         Aortic Arch:            Previously seen  Cavum:                 Previously seen        Ductal Arch:            Previously seen  Ventricles:            Appears normal         Diaphragm:              Previously seen  Choroid Plexus:        Previously seen        Stomach:                Appears normal, left                                                                        sided  Cerebellum:            Previously seen        Abdomen:                Appears normal  Posterior Fossa:       Previously seen         Abdominal Wall:         Previously seen  Nuchal Fold:           Previously seen        Cord Vessels:           Previously seen  Face:                  Orbits and profile     Kidneys:                Appear normal                         previously seen  Lips:                  Previously seen        Bladder:                Appears normal  Thoracic:              Appears normal         Spine:                  Previously seen  Heart:  Appears normal         Upper Extremities:      Previously seen                         (4CH, axis, and situs  RVOT:                  Previously seen        Lower Extremities:      Previously seen  LVOT:                  Previously seen ---------------------------------------------------------------------- Doppler - Fetal Vessels (Fetus B)  Umbilical Artery                                                            ADFV    RDFV                                                              Yes     Yes ---------------------------------------------------------------------- Cervix Uterus Adnexa  Cervix  Length:            3.7  cm.  Normal appearance by transabdominal scan.  Left Ovary  Septated cyst again seen measuring 10 x 5.4 x 4.8cm.  Right Ovary  Within normal limits. ---------------------------------------------------------------------- Impression  Monochorionic-diamniotic twin gestation at 28w 3d.  Twin A:  Cephalic presentation.  Placenta anterior, above cervical os.  Normal amniotic fluid volume. MVP 4.6  Composite fetal growth in the 48%ile.  Normal interval fetal anatomy.  Normal UA Doppler studies without absent or reversed flow.  BPP 8/8  Twin B:  Transvere presentation.  Placenta anterior, above cervical os.  Normal amniotic fluid volume. MVP 4.7  Composite fetal growth in the 15%ile. AC  <3rd%ile.  Normal interval fetal anatomy.  UA Dopplers with persistent absent and intermittently  reversed end diastolic flow.  Normal DV dopplers.  BPP 8/8  Stomach and bladder seen  x 2.  No TTTS  26% growth discordance.  findings consistent with isolated IUGR of Twin B ---------------------------------------------------------------------- Recommendations  Recommend the following:  -rescue antenatal corticosteroids (2nd and final course)  -NICU consultation  -given DM, insulin gtt during steroid administration  -CP neuroprophylaxis with MgSO4  -admit for CEFM until delivery  -repeat Dopplers 2-3x per week (ie, my bias would be for  thrice weekly)  D/w'd Dr. Debroah Loop. ----------------------------------------------------------------------               Durwin Nora, MD Electronically Signed Final Report   05/28/2017 10:47 am ----------------------------------------------------------------------  [place] Mfm Ob Follow Up  Result Date: 05/28/2017 ----------------------------------------------------------------------  OBSTETRICS REPORT                      (Signed Final 05/28/2017 10:47 am) ---------------------------------------------------------------------- Patient Info  ID #:       [id number]                          D.O.B.:  [birth year] (21 yrs)  Name:  [name]                    Visit Date: 05/28/2017 08:15 am ---------------------------------------------------------------------- Performed By  Performed By:     Earley Brooke     Ref. Address:     [facility], [facility]                                                             OB/Gyn Clinic                                                             [address]                                                             [place], [place]                                                             [address]  Attending:        Durwin Nora       Location:         [facility]                    MD  Referred By:      [facility] for                    [facility]                    Healthcare  ---------------------------------------------------------------------- Orders   #  Description                                 Code   1  [place] MFM FETAL BPP WO NON STRESS              60454.09   2  [place] MFM UA CORD DOPPLER                      [phone]  3  [place] MFM FETAL BPP WO NST ADDL                [address].6      GESTATION   4  [place] MFM UA DOPPLER ADDL GEST RE              76820.03      EVAL   5  [place] MFM OB FOLLOW UP                         76816.01   6  [place] MFM OB FOLLOW UP ADDL GEST               04540.98  ----------------------------------------------------------------------   #  Ordered By               Order #        Accession #    Episode #   1  Particia Nearing            [id number]      [id number]     [id number]   2  MARTHA DECKER            [id number]      [id number]     [id number]   3  MARTHA DECKER            [id number]      [id number]     [id number]   4  MARTHA DECKER            [id number]      [id number]     [id number]   5  MARTHA DECKER            [id number]      [id number]     [id number]   6  MARTHA DECKER            [id number]      [id number]     [id number]  ---------------------------------------------------------------------- Indications   [redacted] weeks gestation of pregnancy                Z3A.28   Twin pregnancy, mono/di, second trimester      O30.032   Maternal care for known or suspected poor      O36.5922   fetal growth, second trimester, fetus 2   Absent end diastolic flow (Fetal and           O36.90X0   placental problem in pregnancy) Twin B  ---------------------------------------------------------------------- OB History  Gravidity:    1         Term:   0        Prem:   0        SAB:   0  TOP:          0       Ectopic:  0        Living: 0 ---------------------------------------------------------------------- Fetal Evaluation (Fetus A)  Num Of Fetuses:     2  Fetal Heart         130  Rate(bpm):  Cardiac Activity:   Observed  Fetal Lie:          Maternal left side  Presentation:       Cephalic  Placenta:            Anterior, above cervical os  P. Cord Insertion:  Previously Visualized  Membrane Desc:      Dividing Membrane seen - Monochorionic  Amniotic Fluid  AFI FV:  Subjectively within normal limits                              Largest Pocket(cm)                              4.6 ---------------------------------------------------------------------- Biophysical Evaluation (Fetus A)  Amniotic F.V:   Pocket => 2 cm two         F. Tone:        Observed                  planes  F. Movement:    Observed                   Score:          8/8  F. Breathing:   Observed ---------------------------------------------------------------------- Biometry (Fetus A)  BPD:      70.9  mm     G. Age:  28w 3d         39  %    CI:        79.01   %    70 - 86                                                          FL/HC:      19.9   %    18.8 - 20.6  HC:      252.2  mm     G. Age:  27w 3d          4  %    HC/AC:      1.00        1.05 - 1.21  AC:       251   mm     G. Age:  29w 2d         69  %    FL/BPD:     70.8   %    71 - 87  FL:       50.2  mm     G. Age:  27w 0d          7  %    FL/AC:      20.0   %    20 - 24  HUM:      47.1  mm     G. Age:  27w 5d         30  %  Est. FW:    1205  gm    2 lb 11 oz      48  %     FW Discordancy     0 \ 26 % ---------------------------------------------------------------------- Gestational Age (Fetus A)  LMP:           28w 3d        Date:  11/10/16                 EDD:   08/17/17  U/S Today:     28w 0d  EDD:   08/20/17  Best:          Eden Emms28w 3d     Det. By:  LMP  (11/10/16)          EDD:   08/17/17 ---------------------------------------------------------------------- Anatomy (Fetus A)  Cranium:               Appears normal         Aortic Arch:            Previously seen  Cavum:                 Previously seen        Ductal Arch:            Previously seen  Ventricles:            Appears normal         Diaphragm:              Appears normal  Choroid Plexus:         Previously seen        Stomach:                Appears normal, left                                                                        sided  Cerebellum:            Previously seen        Abdomen:                Appears normal  Posterior Fossa:       Previously seen        Abdominal Wall:         Previously seen  Nuchal Fold:           Previously seen        Cord Vessels:           Previously seen  Face:                  Orbits and profile     Kidneys:                Appear normal                         previously seen  Lips:                  Previously seen        Bladder:                Appears normal  Thoracic:              Appears normal         Spine:                  Previously seen  Heart:                 Appears normal         Upper Extremities:      Previously seen                         (  4CH, axis, and situs  RVOT:                  Previously seen        Lower Extremities:      Previously seen  LVOT:                  Previously seen ---------------------------------------------------------------------- Doppler - Fetal Vessels (Fetus A)  Umbilical Artery   S/D     %tile                                     PSV    ADFV    RDFV                                                   (cm/s)  3.23       60                                     60.72      No      No ---------------------------------------------------------------------- Fetal Evaluation (Fetus B)  Num Of Fetuses:     2  Fetal Heart         149  Rate(bpm):  Cardiac Activity:   Observed  Presentation:       Transverse, head to maternal left  Placenta:           Anterior, above cervical os  P. Cord Insertion:  Previously Visualized  Membrane Desc:      Dividing Membrane seen - Monochorionic  Amniotic Fluid  AFI FV:      Subjectively within normal limits                              Largest Pocket(cm)                              4.7 ---------------------------------------------------------------------- Biophysical Evaluation (Fetus B)  Amniotic F.V:    Pocket => 2 cm two         F. Tone:        Observed                  planes  F. Movement:    Observed                   Score:          8/8  F. Breathing:   Observed ---------------------------------------------------------------------- Biometry (Fetus B)  BPD:      64.4  mm     G. Age:  26w 0d          1  %    CI:        73.16   %    70 - 86  FL/HC:      19.9   %    18.8 - 20.6  HC:      239.3  mm     G. Age:  26w 0d        < 3  %    HC/AC:      1.10        1.05 - 1.21  AC:       218   mm     G. Age:  26w 2d        < 3  %    FL/BPD:     73.9   %    71 - 87  FL:       47.6  mm     G. Age:  26w 0d        < 3  %    FL/AC:      21.8   %    20 - 24  HUM:      43.3  mm     G. Age:  25w 6d        < 5  %  Est. FW:     892  gm    1 lb 15 oz      15  %     FW Discordancy        26  % ---------------------------------------------------------------------- Gestational Age (Fetus B)  LMP:           28w 3d        Date:  11/10/16                 EDD:   08/17/17  U/S Today:     26w 1d                                        EDD:   09/02/17  Best:          28w 3d     Det. By:  LMP  (11/10/16)          EDD:   08/17/17 ---------------------------------------------------------------------- Anatomy (Fetus B)  Cranium:               Appears normal         Aortic Arch:            Previously seen  Cavum:                 Previously seen        Ductal Arch:            Previously seen  Ventricles:            Appears normal         Diaphragm:              Previously seen  Choroid Plexus:        Previously seen        Stomach:                Appears normal, left  sided  Cerebellum:            Previously seen        Abdomen:                Appears normal  Posterior Fossa:       Previously seen        Abdominal Wall:         Previously seen  Nuchal Fold:           Previously seen        Cord Vessels:           Previously  seen  Face:                  Orbits and profile     Kidneys:                Appear normal                         previously seen  Lips:                  Previously seen        Bladder:                Appears normal  Thoracic:              Appears normal         Spine:                  Previously seen  Heart:                 Appears normal         Upper Extremities:      Previously seen                         (4CH, axis, and situs  RVOT:                  Previously seen        Lower Extremities:      Previously seen  LVOT:                  Previously seen ---------------------------------------------------------------------- Doppler - Fetal Vessels (Fetus B)  Umbilical Artery                                                            ADFV    RDFV                                                              Yes     Yes ---------------------------------------------------------------------- Cervix Uterus Adnexa  Cervix  Length:            3.7  cm.  Normal appearance by transabdominal scan.  Left Ovary  Septated cyst again seen measuring 10 x 5.4 x 4.8cm.  Right Ovary  Within normal limits. ---------------------------------------------------------------------- Impression  Monochorionic-diamniotic twin gestation at 28w 3d.  Twin A:  Cephalic presentation.  Placenta anterior,  above cervical os.  Normal amniotic fluid volume. MVP 4.6  Composite fetal growth in the 48%ile.  Normal interval fetal anatomy.  Normal UA Doppler studies without absent or reversed flow.  BPP 8/8  Twin B:  Transvere presentation.  Placenta anterior, above cervical os.  Normal amniotic fluid volume. MVP 4.7  Composite fetal growth in the 15%ile. AC  <3rd%ile.  Normal interval fetal anatomy.  UA Dopplers with persistent absent and intermittently  reversed end diastolic flow.  Normal DV dopplers.  BPP 8/8  Stomach and bladder seen x 2.  No TTTS  26% growth discordance.  findings consistent with isolated IUGR of Twin B  ---------------------------------------------------------------------- Recommendations  Recommend the following:  -rescue antenatal corticosteroids (2nd and final course)  -NICU consultation  -given DM, insulin gtt during steroid administration  -CP neuroprophylaxis with MgSO4  -admit for CEFM until delivery  -repeat Dopplers 2-3x per week (ie, my bias would be for  thrice weekly)  D/w'd Dr. Debroah Loop. ----------------------------------------------------------------------               Durwin Nora, MD Electronically Signed Final Report   05/28/2017 10:47 am ----------------------------------------------------------------------  [place] Mfm Ob Follow Up Addl Gest  Result Date: 05/28/2017 ----------------------------------------------------------------------  OBSTETRICS REPORT                      (Signed Final 05/28/2017 10:47 am) ---------------------------------------------------------------------- Patient Info  ID #:       [id number]                          D.O.B.:  08-Sep-1995 (21 yrs)  Name:       [name]                    Visit Date: 05/28/2017 08:15 am ---------------------------------------------------------------------- Performed By  Performed By:     Earley Brooke     Ref. Address:     [facility]                                                             OB/Gyn Clinic                                                             [address]                                                             Rd  [place], [place]                                                             [address]  Attending:        Durwin Nora       Location:         [facility]                    MD  Referred By:      [facility] for                    [facility]                    Healthcare ---------------------------------------------------------------------- Orders   #  Description                                  Code   1  [place] MFM FETAL BPP WO NON STRESS              14782.95   2  [place] MFM UA CORD DOPPLER                      76820.02   3  [place] MFM FETAL BPP WO NST ADDL                76819.1      GESTATION   4  [place] MFM UA DOPPLER ADDL GEST RE              76820.03      EVAL   5  [place] MFM OB FOLLOW UP                         76816.01   6  [place] MFM OB FOLLOW UP ADDL GEST               62130.86  ----------------------------------------------------------------------   #  Ordered By               Order #        Accession #    Episode #   1  Particia Nearing            [id number]      [id number]     [id number]   2  MARTHA DECKER            [id number]      [id number]     [id number]   3  MARTHA DECKER            [id number]      [id number]     [id number]   4  MARTHA DECKER            [id number]      [id number]     [id number]   5  MARTHA DECKER            [id number]      [id number]     [id number]   6  MARTHA DECKER  [id number]      [id number]     [id number]  ---------------------------------------------------------------------- Indications   [redacted] weeks gestation of pregnancy                Z3A.28   Twin pregnancy, mono/di, second trimester      O30.032   Maternal care for known or suspected poor      O36.5922   fetal growth, second trimester, fetus 2   Absent end diastolic flow (Fetal and           O36.90X0   placental problem in pregnancy) Twin B  ---------------------------------------------------------------------- OB History  Gravidity:    1         Term:   0        Prem:   0        SAB:   0  TOP:          0       Ectopic:  0        Living: 0 ---------------------------------------------------------------------- Fetal Evaluation (Fetus A)  Num Of Fetuses:     2  Fetal Heart         130  Rate(bpm):  Cardiac Activity:   Observed  Fetal Lie:          Maternal left side  Presentation:       Cephalic  Placenta:           Anterior, above cervical os  P. Cord Insertion:  Previously Visualized  Membrane Desc:      Dividing Membrane  seen - Monochorionic  Amniotic Fluid  AFI FV:      Subjectively within normal limits                              Largest Pocket(cm)                              4.6 ---------------------------------------------------------------------- Biophysical Evaluation (Fetus A)  Amniotic F.V:   Pocket => 2 cm two         F. Tone:        Observed                  planes  F. Movement:    Observed                   Score:          8/8  F. Breathing:   Observed ---------------------------------------------------------------------- Biometry (Fetus A)  BPD:      70.9  mm     G. Age:  28w 3d         39  %    CI:        79.01   %    70 - 86                                                          FL/HC:      19.9   %    18.8 - 20.6  HC:      252.2  mm     G. Age:  27w 3d          4  %  HC/AC:      1.00        1.05 - 1.21  AC:       251   mm     G. Age:  29w 2d         69  %    FL/BPD:     70.8   %    71 - 87  FL:       50.2  mm     G. Age:  27w 0d          7  %    FL/AC:      20.0   %    20 - 24  HUM:      47.1  mm     G. Age:  27w 5d         30  %  Est. FW:    1205  gm    2 lb 11 oz      48  %     FW Discordancy     0 \ 26 % ---------------------------------------------------------------------- Gestational Age (Fetus A)  LMP:           28w 3d        Date:  11/10/16                 EDD:   08/17/17  U/S Today:     28w 0d                                        EDD:   08/20/17  Best:          28w 3d     Det. By:  LMP  (11/10/16)          EDD:   08/17/17 ---------------------------------------------------------------------- Anatomy (Fetus A)  Cranium:               Appears normal         Aortic Arch:            Previously seen  Cavum:                 Previously seen        Ductal Arch:            Previously seen  Ventricles:            Appears normal         Diaphragm:              Appears normal  Choroid Plexus:        Previously seen        Stomach:                Appears normal, left                                                                         sided  Cerebellum:            Previously seen        Abdomen:                Appears normal  Posterior Fossa:       Previously seen        Abdominal Wall:         Previously seen  Nuchal Fold:           Previously seen        Cord Vessels:           Previously seen  Face:                  Orbits and profile     Kidneys:                Appear normal                         previously seen  Lips:                  Previously seen        Bladder:                Appears normal  Thoracic:              Appears normal         Spine:                  Previously seen  Heart:                 Appears normal         Upper Extremities:      Previously seen                         (4CH, axis, and situs  RVOT:                  Previously seen        Lower Extremities:      Previously seen  LVOT:                  Previously seen ---------------------------------------------------------------------- Doppler - Fetal Vessels (Fetus A)  Umbilical Artery   S/D     %tile                                     PSV    ADFV    RDFV                                                   (cm/s)  3.23       60                                     60.72      No      No ---------------------------------------------------------------------- Fetal Evaluation (Fetus B)  Num Of Fetuses:     2  Fetal Heart         149  Rate(bpm):  Cardiac Activity:   Observed  Presentation:       Transverse, head to maternal left  Placenta:           Anterior, above cervical os  P. Cord Insertion:  Previously Visualized  Membrane Desc:      Dividing  Membrane seen - Monochorionic  Amniotic Fluid  AFI FV:      Subjectively within normal limits                              Largest Pocket(cm)                              4.7 ---------------------------------------------------------------------- Biophysical Evaluation (Fetus B)  Amniotic F.V:   Pocket => 2 cm two         F. Tone:        Observed                  planes  F. Movement:    Observed                    Score:          8/8  F. Breathing:   Observed ---------------------------------------------------------------------- Biometry (Fetus B)  BPD:      64.4  mm     G. Age:  26w 0d          1  %    CI:        73.16   %    70 - 86                                                          FL/HC:      19.9   %    18.8 - 20.6  HC:      239.3  mm     G. Age:  26w 0d        < 3  %    HC/AC:      1.10        1.05 - 1.21  AC:       218   mm     G. Age:  26w 2d        < 3  %    FL/BPD:     73.9   %    71 - 87  FL:       47.6  mm     G. Age:  26w 0d        < 3  %    FL/AC:      21.8   %    20 - 24  HUM:      43.3  mm     G. Age:  25w 6d        < 5  %  Est. FW:     892  gm    1 lb 15 oz      15  %     FW Discordancy        26  % ---------------------------------------------------------------------- Gestational Age (Fetus B)  LMP:           28w 3d        Date:  11/10/16                 EDD:   08/17/17  U/S Today:     26w 1d  EDD:   09/02/17  Best:          Eden Emms 3d     Det. By:  LMP  (11/10/16)          EDD:   08/17/17 ---------------------------------------------------------------------- Anatomy (Fetus B)  Cranium:               Appears normal         Aortic Arch:            Previously seen  Cavum:                 Previously seen        Ductal Arch:            Previously seen  Ventricles:            Appears normal         Diaphragm:              Previously seen  Choroid Plexus:        Previously seen        Stomach:                Appears normal, left                                                                        sided  Cerebellum:            Previously seen        Abdomen:                Appears normal  Posterior Fossa:       Previously seen        Abdominal Wall:         Previously seen  Nuchal Fold:           Previously seen        Cord Vessels:           Previously seen  Face:                  Orbits and profile     Kidneys:                Appear normal                          previously seen  Lips:                  Previously seen        Bladder:                Appears normal  Thoracic:              Appears normal         Spine:                  Previously seen  Heart:                 Appears normal         Upper Extremities:      Previously seen                         (  4CH, axis, and situs  RVOT:                  Previously seen        Lower Extremities:      Previously seen  LVOT:                  Previously seen ---------------------------------------------------------------------- Doppler - Fetal Vessels (Fetus B)  Umbilical Artery                                                            ADFV    RDFV                                                              Yes     Yes ---------------------------------------------------------------------- Cervix Uterus Adnexa  Cervix  Length:            3.7  cm.  Normal appearance by transabdominal scan.  Left Ovary  Septated cyst again seen measuring 10 x 5.4 x 4.8cm.  Right Ovary  Within normal limits. ---------------------------------------------------------------------- Impression  Monochorionic-diamniotic twin gestation at 28w 3d.  Twin A:  Cephalic presentation.  Placenta anterior, above cervical os.  Normal amniotic fluid volume. MVP 4.6  Composite fetal growth in the 48%ile.  Normal interval fetal anatomy.  Normal UA Doppler studies without absent or reversed flow.  BPP 8/8  Twin B:  Transvere presentation.  Placenta anterior, above cervical os.  Normal amniotic fluid volume. MVP 4.7  Composite fetal growth in the 15%ile. AC  <3rd%ile.  Normal interval fetal anatomy.  UA Dopplers with persistent absent and intermittently  reversed end diastolic flow.  Normal DV dopplers.  BPP 8/8  Stomach and bladder seen x 2.  No TTTS  26% growth discordance.  findings consistent with isolated IUGR of Twin B ---------------------------------------------------------------------- Recommendations  Recommend the following:  -rescue antenatal  corticosteroids (2nd and final course)  -NICU consultation  -given DM, insulin gtt during steroid administration  -CP neuroprophylaxis with MgSO4  -admit for CEFM until delivery  -repeat Dopplers 2-3x per week (ie, my bias would be for  thrice weekly)  D/w'd Dr. Debroah Loop. ----------------------------------------------------------------------               Durwin Nora, MD Electronically Signed Final Report   05/28/2017 10:47 am ----------------------------------------------------------------------  [place] Mfm Ua Cord Doppler  Result Date: 05/28/2017 ----------------------------------------------------------------------  OBSTETRICS REPORT                      (Signed Final 05/28/2017 10:47 am) ---------------------------------------------------------------------- Patient Info  ID #:       [id number]                          D.O.B.:  Feb 27, 1996 (21 yrs)  Name:       [name]                    Visit Date: 05/28/2017 08:15 am ---------------------------------------------------------------------- Performed By  Performed By:     Earley Brooke  Ref. Address:     [facility]                                                             OB/Gyn Clinic                                                             [address]                                                             [place], [place]                                                             [address]  Attending:        Durwin Nora       Location:         [facility]                    MD  Referred By:      [facility] for                    [facility]                    Healthcare ---------------------------------------------------------------------- Orders   #  Description                                 Code   1  [place] MFM FETAL BPP WO NON STRESS              60454.09   2  [place] MFM UA CORD DOPPLER                       76820.02   3  [place] MFM FETAL BPP WO NST ADDL                81191.4      GESTATION   4  [place] MFM UA DOPPLER ADDL GEST RE  40981.19      EVAL   5  [place] MFM OB FOLLOW UP                         [id number]   6  [place] MFM OB FOLLOW UP ADDL GEST               14782.95  ----------------------------------------------------------------------   #  Ordered By               Order #        Accession #    Episode #   1  Particia Nearing            [id number]      [id number]     [id number]   2  MARTHA DECKER            [id number]      [id number]     [id number]   3  MARTHA DECKER            [id number]      [id number]     [id number]   4  MARTHA DECKER            [id number]      [id number]     [id number]   5  MARTHA DECKER            [id number]      [id number]     [id number]   6  MARTHA DECKER            [id number]      [id number]     [id number]  ---------------------------------------------------------------------- Indications   [redacted] weeks gestation of pregnancy                Z3A.28   Twin pregnancy, mono/di, second trimester      O30.032   Maternal care for known or suspected poor      O36.5922   fetal growth, second trimester, fetus 2   Absent end diastolic flow (Fetal and           O36.90X0   placental problem in pregnancy) Twin B  ---------------------------------------------------------------------- OB History  Gravidity:    1         Term:   0        Prem:   0        SAB:   0  TOP:          0       Ectopic:  0        Living: 0 ---------------------------------------------------------------------- Fetal Evaluation (Fetus A)  Num Of Fetuses:     2  Fetal Heart         130  Rate(bpm):  Cardiac Activity:   Observed  Fetal Lie:          Maternal left side  Presentation:       Cephalic  Placenta:           Anterior, above cervical os  P. Cord Insertion:  Previously Visualized  Membrane Desc:      Dividing Membrane seen - Monochorionic  Amniotic Fluid  AFI FV:      Subjectively within normal limits                              Largest Pocket(cm)  4.6 ---------------------------------------------------------------------- Biophysical Evaluation (Fetus A)  Amniotic F.V:   Pocket => 2 cm two         F. Tone:        Observed                  planes  F. Movement:    Observed                   Score:          8/8  F. Breathing:   Observed ---------------------------------------------------------------------- Biometry (Fetus A)  BPD:      70.9  mm     G. Age:  28w 3d         39  %    CI:        79.01   %    70 - 86                                                          FL/HC:      19.9   %    18.8 - 20.6  HC:      252.2  mm     G. Age:  27w 3d          4  %    HC/AC:      1.00        1.05 - 1.21  AC:       251   mm     G. Age:  29w 2d         69  %    FL/BPD:     70.8   %    71 - 87  FL:       50.2  mm     G. Age:  27w 0d          7  %    FL/AC:      20.0   %    20 - 24  HUM:      47.1  mm     G. Age:  27w 5d         30  %  Est. FW:    1205  gm    2 lb 11 oz      48  %     FW Discordancy     0 \ 26 % ---------------------------------------------------------------------- Gestational Age (Fetus A)  LMP:           28w 3d        Date:  11/10/16                 EDD:   08/17/17  U/S Today:     28w 0d                                        EDD:   08/20/17  Best:          28w 3d     Det. By:  LMP  (11/10/16)          EDD:   08/17/17 ---------------------------------------------------------------------- Anatomy (Fetus A)  Cranium:               Appears  normal         Aortic Arch:            Previously seen  Cavum:                 Previously seen        Ductal Arch:            Previously seen  Ventricles:            Appears normal         Diaphragm:              Appears normal  Choroid Plexus:        Previously seen        Stomach:                Appears normal, left                                                                        sided  Cerebellum:            Previously seen        Abdomen:                Appears normal  Posterior Fossa:        Previously seen        Abdominal Wall:         Previously seen  Nuchal Fold:           Previously seen        Cord Vessels:           Previously seen  Face:                  Orbits and profile     Kidneys:                Appear normal                         previously seen  Lips:                  Previously seen        Bladder:                Appears normal  Thoracic:              Appears normal         Spine:                  Previously seen  Heart:                 Appears normal         Upper Extremities:      Previously seen                         (4CH, axis, and situs  RVOT:                  Previously seen        Lower Extremities:      Previously seen  LVOT:  Previously seen ---------------------------------------------------------------------- Doppler - Fetal Vessels (Fetus A)  Umbilical Artery   S/D     %tile                                     PSV    ADFV    RDFV                                                   (cm/s)  3.23       60                                     60.72      No      No ---------------------------------------------------------------------- Fetal Evaluation (Fetus B)  Num Of Fetuses:     2  Fetal Heart         149  Rate(bpm):  Cardiac Activity:   Observed  Presentation:       Transverse, head to maternal left  Placenta:           Anterior, above cervical os  P. Cord Insertion:  Previously Visualized  Membrane Desc:      Dividing Membrane seen - Monochorionic  Amniotic Fluid  AFI FV:      Subjectively within normal limits                              Largest Pocket(cm)                              4.7 ---------------------------------------------------------------------- Biophysical Evaluation (Fetus B)  Amniotic F.V:   Pocket => 2 cm two         F. Tone:        Observed                  planes  F. Movement:    Observed                   Score:          8/8  F. Breathing:   Observed ---------------------------------------------------------------------- Biometry (Fetus  B)  BPD:      64.4  mm     G. Age:  26w 0d          1  %    CI:        73.16   %    70 - 86                                                          FL/HC:      19.9   %    18.8 - 20.6  HC:      239.3  mm     G. Age:  26w 0d        < 3  %    HC/AC:      1.10  1.05 - 1.21  AC:       218   mm     G. Age:  26w 2d        < 3  %    FL/BPD:     73.9   %    71 - 87  FL:       47.6  mm     G. Age:  26w 0d        < 3  %    FL/AC:      21.8   %    20 - 24  HUM:      43.3  mm     G. Age:  25w 6d        < 5  %  Est. FW:     892  gm    1 lb 15 oz      15  %     FW Discordancy        26  % ---------------------------------------------------------------------- Gestational Age (Fetus B)  LMP:           28w 3d        Date:  11/10/16                 EDD:   08/17/17  U/S Today:     26w 1d                                        EDD:   09/02/17  Best:          28w 3d     Det. By:  LMP  (11/10/16)          EDD:   08/17/17 ---------------------------------------------------------------------- Anatomy (Fetus B)  Cranium:               Appears normal         Aortic Arch:            Previously seen  Cavum:                 Previously seen        Ductal Arch:            Previously seen  Ventricles:            Appears normal         Diaphragm:              Previously seen  Choroid Plexus:        Previously seen        Stomach:                Appears normal, left                                                                        sided  Cerebellum:            Previously seen        Abdomen:                Appears normal  Posterior Fossa:       Previously seen  Abdominal Wall:         Previously seen  Nuchal Fold:           Previously seen        Cord Vessels:           Previously seen  Face:                  Orbits and profile     Kidneys:                Appear normal                         previously seen  Lips:                  Previously seen        Bladder:                Appears normal  Thoracic:              Appears normal          Spine:                  Previously seen  Heart:                 Appears normal         Upper Extremities:      Previously seen                         (4CH, axis, and situs  RVOT:                  Previously seen        Lower Extremities:      Previously seen  LVOT:                  Previously seen ---------------------------------------------------------------------- Doppler - Fetal Vessels (Fetus B)  Umbilical Artery                                                            ADFV    RDFV                                                              Yes     Yes ---------------------------------------------------------------------- Cervix Uterus Adnexa  Cervix  Length:            3.7  cm.  Normal appearance by transabdominal scan.  Left Ovary  Septated cyst again seen measuring 10 x 5.4 x 4.8cm.  Right Ovary  Within normal limits. ---------------------------------------------------------------------- Impression  Monochorionic-diamniotic twin gestation at 28w 3d.  Twin A:  Cephalic presentation.  Placenta anterior, above cervical os.  Normal amniotic fluid volume. MVP 4.6  Composite fetal growth in the 48%ile.  Normal interval fetal anatomy.  Normal UA Doppler studies without absent or reversed flow.  BPP 8/8  Twin B:  Transvere presentation.  Placenta anterior, above cervical os.  Normal amniotic fluid volume. MVP 4.7  Composite fetal growth in  the 15%ile. AC  <3rd%ile.  Normal interval fetal anatomy.  UA Dopplers with persistent absent and intermittently  reversed end diastolic flow.  Normal DV dopplers.  BPP 8/8  Stomach and bladder seen x 2.  No TTTS  26% growth discordance.  findings consistent with isolated IUGR of Twin B ---------------------------------------------------------------------- Recommendations  Recommend the following:  -rescue antenatal corticosteroids (2nd and final course)  -NICU consultation  -given DM, insulin gtt during steroid administration  -CP neuroprophylaxis with MgSO4  -admit  for CEFM until delivery  -repeat Dopplers 2-3x per week (ie, my bias would be for  thrice weekly)  D/w'd Dr. Debroah Loop. ----------------------------------------------------------------------               Durwin Nora, MD Electronically Signed Final Report   05/28/2017 10:47 am ----------------------------------------------------------------------  [place] Mfm Ua Doppler Addl Gest Re Eval  Result Date: 05/28/2017 ----------------------------------------------------------------------  OBSTETRICS REPORT                      (Signed Final 05/28/2017 10:47 am) ---------------------------------------------------------------------- Patient Info  ID #:       [id number]                          D.O.B.:  25-Jun-1996 (21 yrs)  Name:       [name]                    Visit Date: 05/28/2017 08:15 am ---------------------------------------------------------------------- Performed By  Performed By:     Earley Brooke     Ref. Address:     [facility]                                                             [facility]yn Clinic                                                             [address]                                                             [place], [place]  [address]  Attending:        Durwin Nora       Location:         [facility]                    MD  Referred By:      [facility] for                    [facility]                    Healthcare ---------------------------------------------------------------------- Orders   #  Description                                 Code   1  [place] MFM FETAL BPP WO NON STRESS              76819.01   2  [place] MFM UA CORD DOPPLER                      76820.02   3  [place] MFM FETAL BPP WO NST ADDL                76819.1      GESTATION   4  [place] MFM UA DOPPLER ADDL GEST RE              76820.03       EVAL   5  [place] MFM OB FOLLOW UP                         76816.01   6  [place] MFM OB FOLLOW UP ADDL GEST               60737.10  ----------------------------------------------------------------------   #  Ordered By               Order #        Accession #    Episode #   1  Particia Nearing            [id number]      [id number]     [id number]   2  MARTHA DECKER            [id number]      [id number]     [id number]   3  MARTHA DECKER            [id number]      [id number]     [id number]   4  MARTHA DECKER            [id number]      [id number]     [id number]   5  MARTHA DECKER            [id number]      [id number]     [id number]   6  MARTHA DECKER            [id number]      [id number]     [id number]  ---------------------------------------------------------------------- Indications   [redacted] weeks gestation of pregnancy                Z3A.28   Twin pregnancy, mono/di, second trimester      O30.032   Maternal care for known or  suspected poor      O36.5922   fetal growth, second trimester, fetus 2   Absent end diastolic flow (Fetal and           O36.90X0   placental problem in pregnancy) Twin B  ---------------------------------------------------------------------- OB History  Gravidity:    1         Term:   0        Prem:   0        SAB:   0  TOP:          0       Ectopic:  0        Living: 0 ---------------------------------------------------------------------- Fetal Evaluation (Fetus A)  Num Of Fetuses:     2  Fetal Heart         130  Rate(bpm):  Cardiac Activity:   Observed  Fetal Lie:          Maternal left side  Presentation:       Cephalic  Placenta:           Anterior, above cervical os  P. Cord Insertion:  Previously Visualized  Membrane Desc:      Dividing Membrane seen - Monochorionic  Amniotic Fluid  AFI FV:      Subjectively within normal limits                              Largest Pocket(cm)                              4.6 ---------------------------------------------------------------------- Biophysical Evaluation (Fetus A)   Amniotic F.V:   Pocket => 2 cm two         F. Tone:        Observed                  planes  F. Movement:    Observed                   Score:          8/8  F. Breathing:   Observed ---------------------------------------------------------------------- Biometry (Fetus A)  BPD:      70.9  mm     G. Age:  28w 3d         39  %    CI:        79.01   %    70 - 86                                                          FL/HC:      19.9   %    18.8 - 20.6  HC:      252.2  mm     G. Age:  27w 3d          4  %    HC/AC:      1.00        1.05 - 1.21  AC:       251   mm     G. Age:  29w 2d         69  %    FL/BPD:  70.8   %    71 - 87  FL:       50.2  mm     G. Age:  27w 0d          7  %    FL/AC:      20.0   %    20 - 24  HUM:      47.1  mm     G. Age:  27w 5d         30  %  Est. FW:    1205  gm    2 lb 11 oz      48  %     FW Discordancy     0 \ 26 % ---------------------------------------------------------------------- Gestational Age (Fetus A)  LMP:           28w 3d        Date:  11/10/16                 EDD:   08/17/17  U/S Today:     28w 0d                                        EDD:   08/20/17  Best:          28w 3d     Det. By:  LMP  (11/10/16)          EDD:   08/17/17 ---------------------------------------------------------------------- Anatomy (Fetus A)  Cranium:               Appears normal         Aortic Arch:            Previously seen  Cavum:                 Previously seen        Ductal Arch:            Previously seen  Ventricles:            Appears normal         Diaphragm:              Appears normal  Choroid Plexus:        Previously seen        Stomach:                Appears normal, left                                                                        sided  Cerebellum:            Previously seen        Abdomen:                Appears normal  Posterior Fossa:       Previously seen        Abdominal Wall:         Previously seen  Nuchal Fold:           Previously seen        Cord Vessels:  Previously seen  Face:                  Orbits and profile     Kidneys:                Appear normal                         previously seen  Lips:                  Previously seen        Bladder:                Appears normal  Thoracic:              Appears normal         Spine:                  Previously seen  Heart:                 Appears normal         Upper Extremities:      Previously seen                         (4CH, axis, and situs  RVOT:                  Previously seen        Lower Extremities:      Previously seen  LVOT:                  Previously seen ---------------------------------------------------------------------- Doppler - Fetal Vessels (Fetus A)  Umbilical Artery   S/D     %tile                                     PSV    ADFV    RDFV                                                   (cm/s)  3.23       60                                     60.72      No      No ---------------------------------------------------------------------- Fetal Evaluation (Fetus B)  Num Of Fetuses:     2  Fetal Heart         149  Rate(bpm):  Cardiac Activity:   Observed  Presentation:       Transverse, head to maternal left  Placenta:           Anterior, above cervical os  P. Cord Insertion:  Previously Visualized  Membrane Desc:      Dividing Membrane seen - Monochorionic  Amniotic Fluid  AFI FV:      Subjectively within normal limits                              Largest Pocket(cm)  4.7 ---------------------------------------------------------------------- Biophysical Evaluation (Fetus B)  Amniotic F.V:   Pocket => 2 cm two         F. Tone:        Observed                  planes  F. Movement:    Observed                   Score:          8/8  F. Breathing:   Observed ---------------------------------------------------------------------- Biometry (Fetus B)  BPD:      64.4  mm     G. Age:  26w 0d          1  %    CI:        73.16   %    70 - 86                                                           FL/HC:      19.9   %    18.8 - 20.6  HC:      239.3  mm     G. Age:  26w 0d        < 3  %    HC/AC:      1.10        1.05 - 1.21  AC:       218   mm     G. Age:  26w 2d        < 3  %    FL/BPD:     73.9   %    71 - 87  FL:       47.6  mm     G. Age:  26w 0d        < 3  %    FL/AC:      21.8   %    20 - 24  HUM:      43.3  mm     G. Age:  25w 6d        < 5  %  Est. FW:     892  gm    1 lb 15 oz      15  %     FW Discordancy        26  % ---------------------------------------------------------------------- Gestational Age (Fetus B)  LMP:           28w 3d        Date:  11/10/16                 EDD:   08/17/17  U/S Today:     26w 1d                                        EDD:   09/02/17  Best:          28w 3d     Det. By:  LMP  (11/10/16)          EDD:   08/17/17 ---------------------------------------------------------------------- Anatomy (Fetus B)  Cranium:  Appears normal         Aortic Arch:            Previously seen  Cavum:                 Previously seen        Ductal Arch:            Previously seen  Ventricles:            Appears normal         Diaphragm:              Previously seen  Choroid Plexus:        Previously seen        Stomach:                Appears normal, left                                                                        sided  Cerebellum:            Previously seen        Abdomen:                Appears normal  Posterior Fossa:       Previously seen        Abdominal Wall:         Previously seen  Nuchal Fold:           Previously seen        Cord Vessels:           Previously seen  Face:                  Orbits and profile     Kidneys:                Appear normal                         previously seen  Lips:                  Previously seen        Bladder:                Appears normal  Thoracic:              Appears normal         Spine:                  Previously seen  Heart:                 Appears normal         Upper Extremities:      Previously seen                          (4CH, axis, and situs  RVOT:                  Previously seen        Lower Extremities:      Previously seen  LVOT:  Previously seen ---------------------------------------------------------------------- Doppler - Fetal Vessels (Fetus B)  Umbilical Artery                                                            ADFV    RDFV                                                              Yes     Yes ---------------------------------------------------------------------- Cervix Uterus Adnexa  Cervix  Length:            3.7  cm.  Normal appearance by transabdominal scan.  Left Ovary  Septated cyst again seen measuring 10 x 5.4 x 4.8cm.  Right Ovary  Within normal limits. ---------------------------------------------------------------------- Impression  Monochorionic-diamniotic twin gestation at 28w 3d.  Twin A:  Cephalic presentation.  Placenta anterior, above cervical os.  Normal amniotic fluid volume. MVP 4.6  Composite fetal growth in the 48%ile.  Normal interval fetal anatomy.  Normal UA Doppler studies without absent or reversed flow.  BPP 8/8  Twin B:  Transvere presentation.  Placenta anterior, above cervical os.  Normal amniotic fluid volume. MVP 4.7  Composite fetal growth in the 15%ile. AC  <3rd%ile.  Normal interval fetal anatomy.  UA Dopplers with persistent absent and intermittently  reversed end diastolic flow.  Normal DV dopplers.  BPP 8/8  Stomach and bladder seen x 2.  No TTTS  26% growth discordance.  findings consistent with isolated IUGR of Twin B ---------------------------------------------------------------------- Recommendations  Recommend the following:  -rescue antenatal corticosteroids (2nd and final course)  -NICU consultation  -given DM, insulin gtt during steroid administration  -CP neuroprophylaxis with MgSO4  -admit for CEFM until delivery  -repeat Dopplers 2-3x per week (ie, my bias would be for  thrice weekly)  D/w'd Dr. Debroah Loop.  ----------------------------------------------------------------------               Durwin Nora, MD Electronically Signed Final Report   05/28/2017 10:47 am ----------------------------------------------------------------------  [place] Mfm Fetal Bpp Wo Nst Addl Gestation  Result Date: 05/28/2017 ----------------------------------------------------------------------  OBSTETRICS REPORT                      (Signed Final 05/28/2017 10:47 am) ---------------------------------------------------------------------- Patient Info  ID #:       [id number]                          D.O.B.:  February 21, 1996 (21 yrs)  Name:       [name]                    Visit Date: 05/28/2017 08:15 am ---------------------------------------------------------------------- Performed By  Performed By:     Earley Brooke     Ref. Address:     [facility]  [facility]                                                             [address]                                                             [place], [place]                                                             [address]  Attending:        Durwin Nora       Location:         [facility]                    MD  Referred By:      [facility] for                    [facility]                    Healthcare ---------------------------------------------------------------------- Orders   #  Description                                 Code   1  [place] MFM FETAL BPP WO NON STRESS              40347.42   2  [place] MFM UA CORD DOPPLER                      76820.02   3  [place] MFM FETAL BPP WO NST ADDL                59563.8      GESTATION   4  [place] MFM UA DOPPLER ADDL GEST RE              76820.03      EVAL   5  [place] MFM OB FOLLOW UP                         76816.01   6  [place] MFM OB FOLLOW UP ADDL GEST                75643.32  ----------------------------------------------------------------------   #  Ordered By  Order #        Accession #    Episode #   1  MARTHA DECKER            [id number]      [id number]     [id number]   2  MARTHA DECKER            [id number]      [id number]     [id number]   3  MARTHA DECKER            [id number]      [id number]     [id number]   4  MARTHA DECKER            [id number]      [id number]     [id number]   5  MARTHA DECKER            [id number]      [id number]     [id number]   6  MARTHA DECKER            [id number]      [id number]     [id number]  ---------------------------------------------------------------------- Indications   [redacted] weeks gestation of pregnancy                Z3A.28   Twin pregnancy, mono/di, second trimester      O30.032   Maternal care for known or suspected poor      O36.5922   fetal growth, second trimester, fetus 2   Absent end diastolic flow (Fetal and           O36.90X0   placental problem in pregnancy) Twin B  ---------------------------------------------------------------------- OB History  Gravidity:    1         Term:   0        Prem:   0        SAB:   0  TOP:          0       Ectopic:  0        Living: 0 ---------------------------------------------------------------------- Fetal Evaluation (Fetus A)  Num Of Fetuses:     2  Fetal Heart         130  Rate(bpm):  Cardiac Activity:   Observed  Fetal Lie:          Maternal left side  Presentation:       Cephalic  Placenta:           Anterior, above cervical os  P. Cord Insertion:  Previously Visualized  Membrane Desc:      Dividing Membrane seen - Monochorionic  Amniotic Fluid  AFI FV:      Subjectively within normal limits                              Largest Pocket(cm)                              4.6 ---------------------------------------------------------------------- Biophysical Evaluation (Fetus A)  Amniotic F.V:   Pocket => 2 cm two         F. Tone:        Observed                  planes  F. Movement:    Observed  Score:          8/8  F. Breathing:   Observed ---------------------------------------------------------------------- Biometry (Fetus A)  BPD:      70.9  mm     G. Age:  28w 3d         39  %    CI:        79.01   %    70 - 86                                                          FL/HC:      19.9   %    18.8 - 20.6  HC:      252.2  mm     G. Age:  27w 3d          4  %    HC/AC:      1.00        1.05 - 1.21  AC:       251   mm     G. Age:  29w 2d         69  %    FL/BPD:     70.8   %    71 - 87  FL:       50.2  mm     G. Age:  27w 0d          7  %    FL/AC:      20.0   %    20 - 24  HUM:      47.1  mm     G. Age:  27w 5d         30  %  Est. FW:    1205  gm    2 lb 11 oz      48  %     FW Discordancy     0 \ 26 % ---------------------------------------------------------------------- Gestational Age (Fetus A)  LMP:           28w 3d        Date:  11/10/16                 EDD:   08/17/17  U/S Today:     28w 0d                                        EDD:   08/20/17  Best:          28w 3d     Det. By:  LMP  (11/10/16)          EDD:   08/17/17 ---------------------------------------------------------------------- Anatomy (Fetus A)  Cranium:               Appears normal         Aortic Arch:            Previously seen  Cavum:                 Previously seen        Ductal Arch:            Previously seen  Ventricles:  Appears normal         Diaphragm:              Appears normal  Choroid Plexus:        Previously seen        Stomach:                Appears normal, left                                                                        sided  Cerebellum:            Previously seen        Abdomen:                Appears normal  Posterior Fossa:       Previously seen        Abdominal Wall:         Previously seen  Nuchal Fold:           Previously seen        Cord Vessels:           Previously seen  Face:                  Orbits and profile     Kidneys:                Appear normal                          previously seen  Lips:                  Previously seen        Bladder:                Appears normal  Thoracic:              Appears normal         Spine:                  Previously seen  Heart:                 Appears normal         Upper Extremities:      Previously seen                         (4CH, axis, and situs  RVOT:                  Previously seen        Lower Extremities:      Previously seen  LVOT:                  Previously seen ---------------------------------------------------------------------- Doppler - Fetal Vessels (Fetus A)  Umbilical Artery   S/D     %tile                                     PSV    ADFV    RDFV                                                   (  cm/s)  3.23       60                                     60.72      No      No ---------------------------------------------------------------------- Fetal Evaluation (Fetus B)  Num Of Fetuses:     2  Fetal Heart         149  Rate(bpm):  Cardiac Activity:   Observed  Presentation:       Transverse, head to maternal left  Placenta:           Anterior, above cervical os  P. Cord Insertion:  Previously Visualized  Membrane Desc:      Dividing Membrane seen - Monochorionic  Amniotic Fluid  AFI FV:      Subjectively within normal limits                              Largest Pocket(cm)                              4.7 ---------------------------------------------------------------------- Biophysical Evaluation (Fetus B)  Amniotic F.V:   Pocket => 2 cm two         F. Tone:        Observed                  planes  F. Movement:    Observed                   Score:          8/8  F. Breathing:   Observed ---------------------------------------------------------------------- Biometry (Fetus B)  BPD:      64.4  mm     G. Age:  26w 0d          1  %    CI:        73.16   %    70 - 86                                                          FL/HC:      19.9   %    18.8 - 20.6  HC:      239.3  mm     G. Age:  26w 0d        < 3  %    HC/AC:       1.10        1.05 - 1.21  AC:       218   mm     G. Age:  26w 2d        < 3  %    FL/BPD:     73.9   %    71 - 87  FL:       47.6  mm     G. Age:  26w 0d        < 3  %    FL/AC:      21.8   %    20 - 24  HUM:      43.3  mm     G. Age:  25w 6d        < 5  %  Est. FW:     892  gm    1 lb 15 oz      15  %     FW Discordancy        26  % ---------------------------------------------------------------------- Gestational Age (Fetus B)  LMP:           28w 3d        Date:  11/10/16                 EDD:   08/17/17  U/S Today:     26w 1d                                        EDD:   09/02/17  Best:          28w 3d     Det. By:  LMP  (11/10/16)          EDD:   08/17/17 ---------------------------------------------------------------------- Anatomy (Fetus B)  Cranium:               Appears normal         Aortic Arch:            Previously seen  Cavum:                 Previously seen        Ductal Arch:            Previously seen  Ventricles:            Appears normal         Diaphragm:              Previously seen  Choroid Plexus:        Previously seen        Stomach:                Appears normal, left                                                                        sided  Cerebellum:            Previously seen        Abdomen:                Appears normal  Posterior Fossa:       Previously seen        Abdominal Wall:         Previously seen  Nuchal Fold:           Previously seen        Cord Vessels:           Previously seen  Face:                  Orbits and profile     Kidneys:                Appear normal  previously seen  Lips:                  Previously seen        Bladder:                Appears normal  Thoracic:              Appears normal         Spine:                  Previously seen  Heart:                 Appears normal         Upper Extremities:      Previously seen                         (4CH, axis, and situs  RVOT:                  Previously seen        Lower Extremities:       Previously seen  LVOT:                  Previously seen ---------------------------------------------------------------------- Doppler - Fetal Vessels (Fetus B)  Umbilical Artery                                                            ADFV    RDFV                                                              Yes     Yes ---------------------------------------------------------------------- Cervix Uterus Adnexa  Cervix  Length:            3.7  cm.  Normal appearance by transabdominal scan.  Left Ovary  Septated cyst again seen measuring 10 x 5.4 x 4.8cm.  Right Ovary  Within normal limits. ---------------------------------------------------------------------- Impression  Monochorionic-diamniotic twin gestation at 28w 3d.  Twin A:  Cephalic presentation.  Placenta anterior, above cervical os.  Normal amniotic fluid volume. MVP 4.6  Composite fetal growth in the 48%ile.  Normal interval fetal anatomy.  Normal UA Doppler studies without absent or reversed flow.  BPP 8/8  Twin B:  Transvere presentation.  Placenta anterior, above cervical os.  Normal amniotic fluid volume. MVP 4.7  Composite fetal growth in the 15%ile. AC  <3rd%ile.  Normal interval fetal anatomy.  UA Dopplers with persistent absent and intermittently  reversed end diastolic flow.  Normal DV dopplers.  BPP 8/8  Stomach and bladder seen x 2.  No TTTS  26% growth discordance.  findings consistent with isolated IUGR of Twin B ---------------------------------------------------------------------- Recommendations  Recommend the following:  -rescue antenatal corticosteroids (2nd and final course)  -NICU consultation  -given DM, insulin gtt during steroid administration  -CP neuroprophylaxis with MgSO4  -admit for CEFM until delivery  -repeat Dopplers 2-3x per week (ie, my bias would be for  thrice weekly)  D/w'd Dr. Debroah Loop. ----------------------------------------------------------------------  Durwin Nora, MD Electronically Signed Final  Report   05/28/2017 10:47 am ----------------------------------------------------------------------    Assessment/Plan Present on Admission: . Twin pregnancy, twins discordant . Pregnancy affected by fetal growth restriction . Twin gestation in third trimester    Code Status: full   Disposition Plan: plan to remain hospitalized until after delivery  Time spent: 40 minutes, face to face and on floor coordination of care  Adam Phenix, MD 05/28/2017 11:39 AM Faculty Practice Attending Physician [facility] Attending Phone #: [phone]

## 2017-05-28 NOTE — Progress Notes (Signed)
Report called to Gerhard Muncherri Taubert, RN @ [phone]. Patient escorted to room 323, ambulatory. Ardelle Balls. Taubert, RN present in room when patient arrived.

## 2017-05-28 NOTE — Progress Notes (Signed)
Dr. Marjo Bickerenney spoke with faculty practice regarding admission of Donna Chen and they have assumed care. Dr. Marjo Bickerenney referred patient to MAU desk for admission to [facility] and stated patient may wait in lobby for bed assignment. Currently waiting for call back from charge nurse on HR OB unit to give report and for room assignment.

## 2017-05-29 ENCOUNTER — Encounter (HOSPITAL_COMMUNITY): Payer: Self-pay | Admitting: *Deleted

## 2017-05-29 DIAGNOSIS — O365932 Maternal care for other known or suspected poor fetal growth, third trimester, fetus 2: Secondary | ICD-10-CM

## 2017-05-29 DIAGNOSIS — O24414 Gestational diabetes mellitus in pregnancy, insulin controlled: Secondary | ICD-10-CM

## 2017-05-29 DIAGNOSIS — O30003 Twin pregnancy, unspecified number of placenta and unspecified number of amniotic sacs, third trimester: Secondary | ICD-10-CM

## 2017-05-29 LAB — GLUCOSE, CAPILLARY
GLUCOSE-CAPILLARY: 151 mg/dL — AB (ref 65–99)
GLUCOSE-CAPILLARY: 154 mg/dL — AB (ref 65–99)
GLUCOSE-CAPILLARY: 176 mg/dL — AB (ref 65–99)
GLUCOSE-CAPILLARY: 179 mg/dL — AB (ref 65–99)
GLUCOSE-CAPILLARY: 193 mg/dL — AB (ref 65–99)
GLUCOSE-CAPILLARY: 194 mg/dL — AB (ref 65–99)
GLUCOSE-CAPILLARY: 200 mg/dL — AB (ref 65–99)
GLUCOSE-CAPILLARY: 239 mg/dL — AB (ref 65–99)
Glucose-Capillary: 163 mg/dL — ABNORMAL HIGH (ref 65–99)
Glucose-Capillary: 169 mg/dL — ABNORMAL HIGH (ref 65–99)
Glucose-Capillary: 175 mg/dL — ABNORMAL HIGH (ref 65–99)
Glucose-Capillary: 211 mg/dL — ABNORMAL HIGH (ref 65–99)
Glucose-Capillary: 185 mg/dL — ABNORMAL HIGH (ref 65–99)
Glucose-Capillary: 203 mg/dL — ABNORMAL HIGH (ref 65–99)

## 2017-05-29 LAB — RPR: RPR: NONREACTIVE

## 2017-05-29 LAB — GC/CHLAMYDIA PROBE AMP (~~LOC~~) NOT AT ARMC
Chlamydia: NEGATIVE
Neisseria Gonorrhea: NEGATIVE

## 2017-05-29 LAB — HIV ANTIBODY (ROUTINE TESTING W REFLEX): HIV Screen 4th Generation wRfx: NONREACTIVE

## 2017-05-29 NOTE — Progress Notes (Signed)
Inpatient Diabetes Program Recommendations  Diabetes Treatment Program Recommendations  ADA Standards of Care 2018 Diabetes in Pregnancy Target Glucose Ranges:  Fasting: 60 - 90 mg/dL Preprandial: 60 - 161105 mg/dL 1 hr postprandial: Less than 140mg /dL (from first bite of meal) 2 hr postprandial: Less than 120 mg/dL (from first bite of meal)   Results for [name], Donna (MRN [id number]) as of 05/29/2017 13:11  Ref. Range 05/28/2017 22:12 05/29/2017 09:36 05/29/2017 11:01 05/29/2017 12:07 05/29/2017 13:06  Glucose-Capillary Latest Ref Range: 65 - 99 mg/dL 811113 (H) 914163 (H) 782169 (H) 179 (H) 151 (H)  Results for [name], Donna (MRN [id number]) as of 05/29/2017 13:11  Ref. Range 05/28/2017 12:24 05/28/2017 14:35 05/28/2017 15:36 05/28/2017 16:40 05/28/2017 17:33 05/28/2017 18:33 05/28/2017 19:50  Glucose-Capillary Latest Ref Range: 65 - 99 mg/dL 93 79 87 86 93 98 578100 (H)   Review of Glycemic Control  Diabetes history: new dx with GDM during last hospital admission Outpatient Diabetes medications: NPH 20 units QAM, NPH 12 units QHS, Novolog 12 units with breakfast, Novolog 9 units with supper Current orders for Inpatient glycemic control: IV insulin drip per GlucoStabilizer   NOTE: Chart reviewed. Patient was recently hospitalized and newly dx with GDM. Patient was discharged new to insulin on 05/26/17 and J. Hanks, MSN, RN, CDE (Inpatient Diabetes Coordinator) talked with patient on 05/26/17 regarding GDM and insulin piror to discharge. Noted patient was readmitted on 05/28/17 and patient was given Betamethasone 12 mg at 13:14 on 11/28 and has already received second dose today at 12:11. Patient is on IV insulin at this time. Recommend to continue IV insulin at this time as glucose is likely to spike further following second dose of Betamethasone. Will continue to follow along.  Thanks, Orlando PennerMarie Quinette Hentges, RN, MSN, CDE Diabetes Coordinator Inpatient Diabetes Program [phone] (Team Pager from 8am to  5pm)

## 2017-05-29 NOTE — Progress Notes (Signed)
Patient ID: Donna Bellowsarita Krogstad, female   DOB: [birth year], 21 y.o.   MRN: [id number]  FACULTY PRACTICE ANTEPARTUM(COMPREHENSIVE) NOTE  Donna Chen is a 21 y.o. G1P0 at 3292w4d who is admitted for fetal growth restriction and abnormal cord dopplers.   Fetal presentation is cephalic. Length of Stay:  1  Days  Subjective:  Patient reports the fetal movement as active. Patient reports uterine contraction  activity as none. Patient reports  vaginal bleeding as none. Patient describes fluid per vagina as None.  Vitals:  Blood pressure 107/81, pulse (!) 103, temperature 97.9 F (36.6 C), temperature source Oral, resp. rate 16, height 4\' 9"  (1.448 m), weight 60.3 kg (133 lb), last menstrual period 11/10/2016, SpO2 100 %. Physical Examination:  General appearance - alert, well appearing, and in no distress Heart - normal rate and regular rhythm Abdomen - soft, nontender, nondistended Fundal Height:  consistent with twins Cervical Exam: Not evaluated. . Extremities: extremities normal, atraumatic, no cyanosis or edema and Homans sign is negative Membranes:intact  Fetal Monitoring:     Fetal Heart Rate A  Mode External filed at 05/29/2017 1000  Baseline Rate (A) 135 bpm filed at 05/29/2017 1000  Variability 6-25 BPM filed at 05/29/2017 1000  Accelerations 15 x 15 filed at 05/29/2017 1000  Decelerations None filed at 05/29/2017 1000  Multiple birth? Y filed at 05/29/2017 0700  Fetal Heart Rate Fetus B  Mode External filed at 05/29/2017 1000  Baseline Rate (B) 130 BPM filed at 05/29/2017 1000  Variability 6-25 BPM filed at 05/29/2017 1000  Accelerations 15 x 15 filed at 05/29/2017 1000  Decelerations None filed at 05/29/2017 1000     Labs:  Results for orders placed or performed during the hospital encounter of 05/28/17 (from the past 24 hour(s))  [facility]   Collection Time: 05/28/17 11:50 AM  Result Value Ref Range   ABO/RH(D) B POS    Antibody Screen  NEG    Sample Expiration 05/31/2017   Urinalysis, Routine w reflex microscopic   Collection Time: 05/28/17 12:07 PM  Result Value Ref Range   Color, Urine YELLOW YELLOW   APPearance CLEAR CLEAR   Specific Gravity, Urine 1.008 1.005 - 1.030   pH 7.0 5.0 - 8.0   Glucose, UA NEGATIVE NEGATIVE mg/dL   Hgb urine dipstick NEGATIVE NEGATIVE   Bilirubin Urine NEGATIVE NEGATIVE   Ketones, ur NEGATIVE NEGATIVE mg/dL   Protein, ur NEGATIVE NEGATIVE mg/dL   Nitrite NEGATIVE NEGATIVE   Leukocytes, UA NEGATIVE NEGATIVE  Glucose, capillary   Collection Time: 05/28/17 12:24 PM  Result Value Ref Range   Glucose-Capillary 93 65 - 99 mg/dL  CBC on admission   Collection Time: 05/28/17 12:40 PM  Result Value Ref Range   WBC 9.7 4.0 - 10.5 K/uL   RBC 3.97 3.87 - 5.11 MIL/uL   Hemoglobin 10.9 (L) 12.0 - 15.0 g/dL   HCT 40.934.2 (L) 81.136.0 - 91.446.0 %   MCV 86.1 78.0 - 100.0 fL   MCH 27.5 26.0 - 34.0 pg   MCHC 31.9 30.0 - 36.0 g/dL   RDW 78.213.7 95.611.5 - 21.315.5 %   Platelets 161 150 - 400 K/uL  HIV antibody   Collection Time: 05/28/17 12:40 PM  Result Value Ref Range   HIV Screen 4th Generation wRfx Non Reactive Non Reactive  RPR   Collection Time: 05/28/17 12:40 PM  Result Value Ref Range   RPR Ser Ql Non Reactive Non Reactive  Glucose, capillary   Collection  Time: 05/28/17  2:35 PM  Result Value Ref Range   Glucose-Capillary 79 65 - 99 mg/dL  Glucose, capillary   Collection Time: 05/28/17  3:36 PM  Result Value Ref Range   Glucose-Capillary 87 65 - 99 mg/dL  Glucose, capillary   Collection Time: 05/28/17  4:40 PM  Result Value Ref Range   Glucose-Capillary 86 65 - 99 mg/dL  Glucose, capillary   Collection Time: 05/28/17  5:33 PM  Result Value Ref Range   Glucose-Capillary 93 65 - 99 mg/dL  Glucose, capillary   Collection Time: 05/28/17  6:33 PM  Result Value Ref Range   Glucose-Capillary 98 65 - 99 mg/dL  Glucose, capillary   Collection Time: 05/28/17  7:50 PM  Result Value Ref Range    Glucose-Capillary 100 (H) 65 - 99 mg/dL  Glucose, capillary   Collection Time: 05/28/17 10:12 PM  Result Value Ref Range   Glucose-Capillary 113 (H) 65 - 99 mg/dL  Glucose, capillary   Collection Time: 05/29/17  9:36 AM  Result Value Ref Range   Glucose-Capillary 163 (H) 65 - 99 mg/dL  Glucose, capillary   Collection Time: 05/29/17 11:01 AM  Result Value Ref Range   Glucose-Capillary 169 (H) 65 - 99 mg/dL      Medications:  Scheduled . betamethasone acetate-betamethasone sodium phosphate  12 mg Intramuscular Q24H  . docusate sodium  100 mg Oral Daily  . insulin regular  0-10 Units Intravenous TID WC  . prenatal multivitamin  1 tablet Oral Q1200   I have reviewed the patient's current medications.  ASSESSMENT: Patient Active Problem List   Diagnosis Date Noted  . Twin gestation in third trimester 05/28/2017  . Gestational diabetes 05/28/2017  . Pregnancy affected by fetal growth restriction 05/23/2017  . [redacted] weeks gestation of pregnancy   . Abnormal fetal ultrasound   . Inter-twin discordance, antepartum 05/13/2017  . Twin pregnancy, twins discordant 05/13/2017  . Monochorionic diamniotic twin gestation in second trimester 04/17/2017  . Pregnancy, supervision, high-risk 02/20/2017    PLAN: Follow BG and treat appropriately as betamethasone may cause a spike Continuous monitoring for now, currently reassuring Dopplers in MFM tomorrow and three times a week  Scheryl DarterJames Damiah Mcdonald 05/29/2017,11:38 AM

## 2017-05-29 NOTE — Progress Notes (Signed)
Dr. Debroah LoopArnold contacted with blood sugar of 163. Instructed to restart glucose stabilizer. Carmelina DaneERRI L Anamarie Hunn, RN

## 2017-05-30 ENCOUNTER — Ambulatory Visit (HOSPITAL_COMMUNITY)
Admission: RE | Admit: 2017-05-30 | Discharge: 2017-05-30 | Disposition: A | Discharge status: Home / Self Care | Payer: Medicaid Other | Source: Ambulatory Visit | Attending: Advanced Practice Midwife | Admitting: Advanced Practice Midwife

## 2017-05-30 DIAGNOSIS — O30033 Twin pregnancy, monochorionic/diamniotic, third trimester: Secondary | ICD-10-CM

## 2017-05-30 LAB — GLUCOSE, CAPILLARY
GLUCOSE-CAPILLARY: 106 mg/dL — AB (ref 65–99)
GLUCOSE-CAPILLARY: 111 mg/dL — AB (ref 65–99)
GLUCOSE-CAPILLARY: 114 mg/dL — AB (ref 65–99)
GLUCOSE-CAPILLARY: 118 mg/dL — AB (ref 65–99)
GLUCOSE-CAPILLARY: 120 mg/dL — AB (ref 65–99)
GLUCOSE-CAPILLARY: 132 mg/dL — AB (ref 65–99)
GLUCOSE-CAPILLARY: 136 mg/dL — AB (ref 65–99)
GLUCOSE-CAPILLARY: 139 mg/dL — AB (ref 65–99)
GLUCOSE-CAPILLARY: 149 mg/dL — AB (ref 65–99)
GLUCOSE-CAPILLARY: 179 mg/dL — AB (ref 65–99)
Glucose-Capillary: 124 mg/dL — ABNORMAL HIGH (ref 65–99)
Glucose-Capillary: 167 mg/dL — ABNORMAL HIGH (ref 65–99)
Glucose-Capillary: 120 mg/dL — ABNORMAL HIGH (ref 65–99)
Glucose-Capillary: 73 mg/dL (ref 65–99)
Glucose-Capillary: 138 mg/dL — ABNORMAL HIGH (ref 65–99)
Glucose-Capillary: 113 mg/dL — ABNORMAL HIGH (ref 65–99)
Glucose-Capillary: 142 mg/dL — ABNORMAL HIGH (ref 65–99)
Glucose-Capillary: 117 mg/dL — ABNORMAL HIGH (ref 65–99)

## 2017-05-30 LAB — CULTURE, BETA STREP (GROUP B ONLY)

## 2017-05-30 NOTE — Progress Notes (Signed)
Initial Nutrition Assessment  DOCUMENTATION CODES:   Not applicable  INTERVENTION:  Carbohydrate modified gestational diet  NUTRITION DIAGNOSIS:   Increased nutrient needs related to (twin pregnancy) as evidenced by (28 weeks IUP).  GOAL:  Patient will meet greater than or equal to 90% of their needs  MONITOR:  Weight trends  REASON FOR ASSESSMENT:  Antenatal, Gestational Diabetes   ASSESSMENT:   28 5/7 weeks, twins. fetal growth restricition. Pre preg BMI 22.9. Weight at initial PNV 14 weeks, 113 lbs. 20 lb weight gain  Pt to be in house until delivery. Family will bring food from home occasionally, much of which is rice and bean based. Use 45 g of CHO as count for insulin for food from home   Diet Order:  Diet gestational carb mod Room service appropriate? Yes; Fluid consistency: Thin  EDUCATION NEEDS:   No education needs have been identified at this time provided copy of GDM diet education, explained meal schedule,   Skin:  Skin Assessment: Reviewed RN Assessment  Height:   Ht Readings from Last 1 Encounters:  05/28/17 4\' 9"  (1.448 m)   Weight:   Wt Readings from Last 1 Encounters:  05/28/17 133 lb (60.3 kg)   Ideal Body Weight:    90-100 lbs  BMI:  Body mass index is 28.78 kg/m.  Estimated Nutritional Needs:   Kcal:  1700-1900  Protein:  75-85 g  Fluid:  2 L   Elisabeth CaraKatherine Kolt Mcwhirter M.Odis LusterEd. R.D. LDN Neonatal Nutrition Support Specialist/RD III Pager [phone]      Phone [phone]

## 2017-05-30 NOTE — Progress Notes (Signed)
Pt is off EFM and floor to MFM. Carmelina DaneERRI L Alexx Giambra, RN

## 2017-05-30 NOTE — Progress Notes (Signed)
Inpatient Diabetes Program Recommendations  Diabetes Treatment Program Recommendations  ADA Standards of Care 2018 Diabetes in Pregnancy Target Glucose Ranges:  Fasting: 60 - 90 mg/dL Preprandial: 60 - 161105 mg/dL 1 hr postprandial: Less than 140mg /dL (from first bite of meal) 2 hr postprandial: Less than 120 mg/dL (from first bite of meal)   Results for [name], Donna (MRN [id number]) as of 05/30/2017 07:49  Ref. Range 05/29/2017 22:31 05/29/2017 23:32 05/30/2017 00:31 05/30/2017 01:27 05/30/2017 02:28 05/30/2017 03:33 05/30/2017 04:31 05/30/2017 05:31 05/30/2017 06:28  Glucose-Capillary Latest Ref Range: 65 - 99 mg/dL 811176 (H) 914154 (H) 782149 (H) 124 (H) 118 (H) 132 (H) 167 (H) 139 (H) 120 (H)   Review of Glycemic Control  Diabetes history: new dx with GDM during last hospital admission Outpatient Diabetes medications: NPH 20 units QAM, NPH 12 units QHS, Novolog 12 units with breakfast, Novolog 9 units with supper Current orders for Inpatient glycemic control: IV insulin drip per GlucoStabilizer  Inpatient Diabetes Program Recommendations: Insulin-Basal: When MD is ready to transition patient off IV insulin, please consider ordering NPH 20 units QAM and NPH 12 units QHS. Insulin-Meal Coverage: When MD is ready to transition patient off IV insulin, please consider ordering Novolog 12 units with breakfast, Novolog 5 units with lunch, and Novolog 9 units with supper. Insulin-Correction: When MD is ready to transition patient off IV insulin, please consider using Diabetic Pregnant Patient order set to order CBGs and Novolog correction QID (fasting and 2 hour post prandial).  NOTE: When MD is ready to transition patient off IV insulin, please consider ordering NPH and Novolog as patient was taking as an outpatient along with Novolog 5 units for lunch coverage, and Novolog correction scale QID (fasting and 2 hour post prandial) using Diabetic Pregnant Patient order set.  Thanks. Donna PennerMarie Tyeler Goedken, RN,  MSN, CDE Diabetes Coordinator Inpatient Diabetes Program [phone] (Team Pager from 8am to 5pm)

## 2017-05-30 NOTE — Progress Notes (Signed)
Patient ID: Donna Chen, female   DOB: 28-Jul-1995, 21 y.o.   MRN: [id number] FACULTY PRACTICE ANTEPARTUM(COMPREHENSIVE) NOTE  Donna Chen is a 21 y.o. G1P0 at [redacted]w[redacted]d who is admitted for growth restriction and abnormal cord dopplers with twins.   Fetal presentation is cephalic. Length of Stay:  2  Days  Subjective: Just had dopplers and BPP repeated Patient reports the fetal movement as active. Patient reports uterine contraction  activity as none. Patient reports  vaginal bleeding as none. Patient describes fluid per vagina as None.  Vitals:  Blood pressure 114/73, pulse 70, temperature 97.6 F (36.4 C), temperature source Oral, resp. rate 16, height 4\' 9"  (1.448 m), weight 133 lb (60.3 kg), last menstrual period 11/10/2016, SpO2 100 %. Physical Examination:  General appearance - alert, well appearing, and in no distress Heart - normal rate and regular rhythm Abdomen - soft, nontender, nondistended Fundal Height:  consistent with twins Cervical Exam: Not evaluated. Extremities: extremities normal, atraumatic, no cyanosis or edema and Homans sign is negative, no sign of DVT  Membranes:intact  Fetal Monitoring:     Fetal Heart Rate A  Mode External filed at 05/30/2017 0730  Baseline Rate (A) 135 bpm filed at 05/30/2017 0730  Variability 6-25 BPM filed at 05/30/2017 0730  Accelerations 15 x 15 filed at 05/30/2017 0730  Decelerations None filed at 05/30/2017 0730  Multiple birth? Y filed at 05/29/2017 0700  Fetal Heart Rate Fetus B  Mode External filed at 05/30/2017 0730  Baseline Rate (B) 135 BPM filed at 05/30/2017 0730  Variability 6-25 BPM filed at 05/30/2017 0730  Accelerations 15 x 15 filed at 05/30/2017 0730  Decelerations None filed at 05/30/2017 0730   ----------------------------------------------------------------------  OBSTETRICS REPORT                      (Signed Final 05/30/2017 08:38  am) ---------------------------------------------------------------------- Patient Info  ID #:       [id number]                          D.O.B.:  February 13, 1996 (21 yrs)  Name:       Donna Chen                    Visit Date: 05/30/2017 07:22 am ---------------------------------------------------------------------- Performed By  Performed By:     Tommi Emery         Ref. Address:     [facility]                    RDMS                                                             [facility]                                                             [address]                                                             [place], [place]                                                             [phone]  Attending:        Ledon SnareBrian Brost MD         [facility]         [facility]  Referred By:      [facility]                    Center for                    [facility] ---------------------------------------------------------------------- Orders   #  Description                                 Code   1  US MFM FETAL BPP WO NON STRESS              [id number]   2  US MFM UA CORD DOPPLER                      76820.02   3  US MFM FETAL BPP WO NST ADDL                [id number]      GESTATION   4  US MFM UA DOPPLER ADDL GEST RE              76820.03      EVAL  ----------------------------------------------------------------------   #  Ordered By               Order #        Accession #    Episode #   1  MARTHA DECKER            [id number]      [id number]     [id number]   2  MARTHA DECKER            [id number]      [id number]     [id number]   3  MARTHA DECKER            [id number]      [id number]     [id number]   4  MARTHA DECKER            [id number]      [phone]     [id number]  ---------------------------------------------------------------------- Indications   [redacted] weeks gestation of pregnancy                 Z3A.28   Absent end diastolic flow (Fetal and           O36.90X0   placental problem in pregnancy) Twin B   Twin pregnancy, mono/di, third  trimester       O30.033   Maternal care for known or suspected poor      O36.5932   fetal growth, third trimester, fetus 2  ---------------------------------------------------------------------- OB History  Gravidity:    1         Term:   0        Prem:   0        SAB:   0  TOP:          0       Ectopic:  0        Living: 0 ---------------------------------------------------------------------- Fetal Evaluation (Fetus A)  Num Of Fetuses:     2  Fetal Heart         130  Rate(bpm):  Cardiac Activity:   Observed  Fetal Lie:          Maternal left side  Presentation:       Cephalic  Amniotic Fluid  AFI FV:      Subjectively within normal limits                              Largest Pocket(cm)                              4.65 ---------------------------------------------------------------------- Biophysical Evaluation (Fetus A)  Amniotic F.V:   Within normal limits       F. Tone:        Observed  F. Movement:    Observed                   Score:          6/8  F. Breathing:   Not Observed ---------------------------------------------------------------------- Gestational Age (Fetus A)  LMP:           28w 5d        Date:  11/10/16                 EDD:   08/17/17  Best:          Eden Emms28w 5d     Det. By:  LMP  (11/10/16)          EDD:   08/17/17 ---------------------------------------------------------------------- Doppler - Fetal Vessels (Fetus A)  Umbilical Artery   S/D     %tile     RI              PI              PSV    ADFV    RDFV                                                   (cm/s)  2.86       45   0.65             1.01             41.82      No      No ---------------------------------------------------------------------- Fetal Evaluation (Fetus B)  Num Of Fetuses:     2  Fetal Heart         142  Rate(bpm):  Cardiac Activity:    Observed  Fetal Lie:  Maternal right side  Presentation:       Breech  Amniotic Fluid  AFI FV:      Subjectively within normal limits                              Largest Pocket(cm)                              5.29 ---------------------------------------------------------------------- Biophysical Evaluation (Fetus B)  Amniotic F.V:   Within normal limits       F. Tone:        Observed  F. Movement:    Observed                   Score:          6/8  F. Breathing:   Not Observed ---------------------------------------------------------------------- Gestational Age (Fetus B)  LMP:           28w 5d        Date:  11/10/16                 EDD:   08/17/17  Best:          Eden Emms 5d     Det. By:  LMP  (11/10/16)          EDD:   08/17/17 ---------------------------------------------------------------------- Doppler - Fetal Vessels (Fetus B)  Umbilical Artery                                                            ADFV    RDFV                                                              Yes      No ---------------------------------------------------------------------- Impression  Mo Di Twin gestation at 28 weeks 5 days gestation with fetal  cardiac activity x2  Cephalic / Breech presentation  BPP 6/8 for both babies with -2 for fetal breathing  UA Doppler of twin A with normal appearing flow and twin B  with intermittent absent end diastolic flow ---------------------------------------------------------------------- Recommendations  Report called to Faculty on call ----------------------------------------------------------------------                   Ledon Snare, MD Electronically Signed Final Report   05/30/2017 08:38 am  Labs:  Results for orders placed or performed during the hospital encounter of 05/28/17 (from the past 24 hour(s))  Glucose, capillary   Collection Time: 05/29/17 11:01 AM  Result Value Ref Range   Glucose-Capillary 169 (H) 65 - 99 mg/dL  Glucose,  capillary   Collection Time: 05/29/17 12:07 PM  Result Value Ref Range   Glucose-Capillary 179 (H) 65 - 99 mg/dL  Glucose, capillary   Collection Time: 05/29/17  1:06 PM  Result Value Ref Range   Glucose-Capillary 151 (H) 65 - 99 mg/dL  Glucose, capillary   Collection Time: 05/29/17  2:06 PM  Result Value Ref Range   Glucose-Capillary 175 (H) 65 - 99  mg/dL  Glucose, capillary   Collection Time: 05/29/17  3:15 PM  Result Value Ref Range   Glucose-Capillary 211 (H) 65 - 99 mg/dL  Glucose, capillary   Collection Time: 05/29/17  4:12 PM  Result Value Ref Range   Glucose-Capillary 239 (H) 65 - 99 mg/dL  Glucose, capillary   Collection Time: 05/29/17  5:17 PM  Result Value Ref Range   Glucose-Capillary 185 (H) 65 - 99 mg/dL  Glucose, capillary   Collection Time: 05/29/17  6:29 PM  Result Value Ref Range   Glucose-Capillary 203 (H) 65 - 99 mg/dL  Glucose, capillary   Collection Time: 05/29/17  7:29 PM  Result Value Ref Range   Glucose-Capillary 200 (H) 65 - 99 mg/dL  Glucose, capillary   Collection Time: 05/29/17  8:32 PM  Result Value Ref Range   Glucose-Capillary 194 (H) 65 - 99 mg/dL  Glucose, capillary   Collection Time: 05/29/17  9:31 PM  Result Value Ref Range   Glucose-Capillary 193 (H) 65 - 99 mg/dL  Glucose, capillary   Collection Time: 05/29/17 10:31 PM  Result Value Ref Range   Glucose-Capillary 176 (H) 65 - 99 mg/dL  Glucose, capillary   Collection Time: 05/29/17 11:32 PM  Result Value Ref Range   Glucose-Capillary 154 (H) 65 - 99 mg/dL  Glucose, capillary   Collection Time: 05/30/17 12:31 AM  Result Value Ref Range   Glucose-Capillary 149 (H) 65 - 99 mg/dL  Glucose, capillary   Collection Time: 05/30/17  1:27 AM  Result Value Ref Range   Glucose-Capillary 124 (H) 65 - 99 mg/dL  Glucose, capillary   Collection Time: 05/30/17  2:28 AM  Result Value Ref Range   Glucose-Capillary 118 (H) 65 - 99 mg/dL  Glucose, capillary   Collection Time: 05/30/17  3:33  AM  Result Value Ref Range   Glucose-Capillary 132 (H) 65 - 99 mg/dL  Glucose, capillary   Collection Time: 05/30/17  4:31 AM  Result Value Ref Range   Glucose-Capillary 167 (H) 65 - 99 mg/dL  Glucose, capillary   Collection Time: 05/30/17  5:31 AM  Result Value Ref Range   Glucose-Capillary 139 (H) 65 - 99 mg/dL  Glucose, capillary   Collection Time: 05/30/17  6:28 AM  Result Value Ref Range   Glucose-Capillary 120 (H) 65 - 99 mg/dL  Glucose, capillary   Collection Time: 05/30/17  7:24 AM  Result Value Ref Range   Glucose-Capillary 120 (H) 65 - 99 mg/dL  Glucose, capillary   Collection Time: 05/30/17  8:43 AM  Result Value Ref Range   Glucose-Capillary 73 65 - 99 mg/dL     Medications:  Scheduled . docusate sodium  100 mg Oral Daily  . insulin regular  0-10 Units Intravenous TID WC  . prenatal multivitamin  1 tablet Oral Q1200   I have reviewed the patient's current medications.  ASSESSMENT: Patient Active Problem List   Diagnosis Date Noted  . Twin gestation in third trimester 05/28/2017  . Gestational diabetes 05/28/2017  . Pregnancy affected by fetal growth restriction 05/23/2017  . [redacted] weeks gestation of pregnancy   . Abnormal fetal ultrasound   . Inter-twin discordance, antepartum 05/13/2017  . Twin pregnancy, twins discordant 05/13/2017  . Monochorionic diamniotic twin gestation in second trimester 04/17/2017  . Pregnancy, supervision, high-risk 02/20/2017    PLAN: BG monitoring with glucostabilizer for GDM and recent steroid injections Continuous monitoring and f/u US in 3 days  Scheryl Darter 05/30/2017,9:43 AM

## 2017-05-30 NOTE — Progress Notes (Signed)
Pt's CBG's have been WNL. Called Dr. Alysia Chen to see if he wanted to continue with the glucose stabilizer. Ok to d/c. See new orders. Also asked if pt could shower. Ok for pt to shower.

## 2017-05-31 LAB — GLUCOSE, CAPILLARY
GLUCOSE-CAPILLARY: 119 mg/dL — AB (ref 65–99)
GLUCOSE-CAPILLARY: 138 mg/dL — AB (ref 65–99)
GLUCOSE-CAPILLARY: 155 mg/dL — AB (ref 65–99)
Glucose-Capillary: 134 mg/dL — ABNORMAL HIGH (ref 65–99)
Glucose-Capillary: 119 mg/dL — ABNORMAL HIGH (ref 65–99)

## 2017-05-31 LAB — TYPE AND SCREEN
ABO/RH(D): B POS
ANTIBODY SCREEN: NEGATIVE

## 2017-05-31 NOTE — Progress Notes (Signed)
Patient's lunch has 24 grams of carbohydrates.

## 2017-05-31 NOTE — Progress Notes (Addendum)
FACULTY PRACTICE ANTEPARTUM PROGRESS NOTE  Bynum Bellowsarita Kirschenmann is a 21 y.o. G1P0 at 3144w6d with mono-di twins who is admitted for intermittent reversed end diastolic flow on Twin B.  Estimated Date of Delivery: 08/17/17 Fetal presentation is cephalic and breech.  Length of Stay:  4 Days. Admitted 05/28/2017  Subjective:  Patient reports normal fetal movement.  She denies uterine contractions, denies  bleeding and leaking of fluid per vagina.  Vitals:  Blood pressure 124/83, pulse 78, temperature 98.8 F (37.1 C), temperature source Oral, resp. rate 16, height 4\' 9"  (1.448 m), weight 133 lb (60.3 kg), last menstrual period 11/10/2016, SpO2 99 %. Physical Examination: CONSTITUTIONAL: Well-developed, well-nourished female in no acute distress.  HENT:  Normocephalic, atraumatic, External right and left ear normal. Oropharynx is clear and moist EYES: Conjunctivae and EOM are normal. Pupils are equal, round, and reactive to light. No scleral icterus.  NECK: Normal range of motion, supple, no masses. SKIN: Skin is warm and dry. No rash noted. Not diaphoretic. No erythema. No pallor. NEUROLGIC: Alert and oriented to person, place, and time. Normal reflexes, muscle tone coordination. No cranial nerve deficit noted. PSYCHIATRIC: Normal mood and affect. Normal behavior. Normal judgment and thought content. CARDIOVASCULAR: Normal heart rate noted, regular rhythm RESPIRATORY: Effort and breath sounds normal, no problems with respiration noted MUSCULOSKELETAL: Normal range of motion. No edema and no tenderness. ABDOMEN: Soft, nontender, nondistended, gravid. CERVIX:  deferred  Fetal monitoring: FHR: not on monitor yet this am A: 145 bpm, Variability: moderate, Accelerations: Present, Decelerations: Absent  B:  145 bpm, Variability: moderate, Accelerations: Present, Decelerations: Absent  Uterine activity: no contractions per hour    Current scheduled medications . docusate sodium  100 mg Oral Daily  .  insulin aspart  12 Units Subcutaneous Q breakfast  . insulin aspart  9 Units Subcutaneous QAC supper  . insulin NPH Human  9 Units Subcutaneous QHS  . prenatal multivitamin  1 tablet Oral Q1200    I have reviewed the patient's current medications.  ASSESSMENT: Active Problems:   Twin pregnancy, twins discordant   Pregnancy affected by fetal growth restriction   Twin gestation in third trimester   Gestational diabetes   PLAN:  FWB - EFM TID - reviewed plan to repeat US Monday  gDM - monitor CBGs - s/p gluco-stabilizer - restart home insulin regimen NPH 20 units Q am, 12 units QHS novolog 12 units breakfast, 9 units with supper   Continue routine antenatal care.   Baldemar LenisK. Meryl Yahsir Wickens, M.D. Attending Obstetrician & Gynecologist, [facility] for [facility], [facility]

## 2017-05-31 NOTE — Progress Notes (Signed)
Pt taken off monitor. See new orders for EFM.

## 2017-05-31 NOTE — Progress Notes (Signed)
Patient ID: Donna Bellowsarita Stotler, female   DOB: December 28, 1995, 21 y.o.   MRN: [id number] ACULTY PRACTICE ANTEPARTUM COMPREHENSIVE PROGRESS NOTE  Donna Chen is a 21 y.o. G1P0 at 4634w6d  who is admitted for IUGR and abnormal doppler studies in twin gestation  Fetal presentation is Vtx/Breech Length of Stay:  3  Days  Subjective: Pt without complaints this morning. Reports + FM x 2. Denies VB, LOF or ut ctx. Tolerating diet. Voiding without problems.   Vitals:  Blood pressure (!) 125/93, pulse 75, temperature 97.9 F (36.6 C), temperature source Oral, resp. rate 15, height 4\' 9"  (1.448 m), weight 60.3 kg (133 lb), last menstrual period 11/10/2016, SpO2 100 %. Physical Examination: Lungs clear  Heart RRR Abd soft + BS gravid Ext non tender   Fetal Monitoring:  140-160's, + accels, reactive, good LTV x 2  Labs:  Results for orders placed or performed during the hospital encounter of 05/28/17 (from the past 24 hour(s))  Glucose, capillary   Collection Time: 05/30/17  8:43 AM  Result Value Ref Range   Glucose-Capillary 73 65 - 99 mg/dL  Glucose, capillary   Collection Time: 05/30/17 10:30 AM  Result Value Ref Range   Glucose-Capillary 136 (H) 65 - 99 mg/dL  Glucose, capillary   Collection Time: 05/30/17 11:36 AM  Result Value Ref Range   Glucose-Capillary 138 (H) 65 - 99 mg/dL  Glucose, capillary   Collection Time: 05/30/17 12:49 PM  Result Value Ref Range   Glucose-Capillary 113 (H) 65 - 99 mg/dL  Glucose, capillary   Collection Time: 05/30/17  1:56 PM  Result Value Ref Range   Glucose-Capillary 142 (H) 65 - 99 mg/dL  Glucose, capillary   Collection Time: 05/30/17  3:02 PM  Result Value Ref Range   Glucose-Capillary 114 (H) 65 - 99 mg/dL  Glucose, capillary   Collection Time: 05/30/17  4:09 PM  Result Value Ref Range   Glucose-Capillary 106 (H) 65 - 99 mg/dL  Glucose, capillary   Collection Time: 05/30/17  4:58 PM  Result Value Ref Range   Glucose-Capillary 117 (H) 65 - 99 mg/dL   Glucose, capillary   Collection Time: 05/30/17  6:03 PM  Result Value Ref Range   Glucose-Capillary 111 (H) 65 - 99 mg/dL  Glucose, capillary   Collection Time: 05/30/17 10:39 PM  Result Value Ref Range   Glucose-Capillary 179 (H) 65 - 99 mg/dL    Imaging Studies:    none   Medications:  Scheduled . docusate sodium  100 mg Oral Daily  . prenatal multivitamin  1 tablet Oral Q1200   I have reviewed the patient's current medications.  ASSESSMENT: Patient Active Problem List   Diagnosis Date Noted  . Twin gestation in third trimester 05/28/2017  . Gestational diabetes 05/28/2017  . Pregnancy affected by fetal growth restriction 05/23/2017  . [redacted] weeks gestation of pregnancy   . Abnormal fetal ultrasound   . Inter-twin discordance, antepartum 05/13/2017  . Twin pregnancy, twins discordant 05/13/2017  . Monochorionic diamniotic twin gestation in second trimester 04/17/2017  . Pregnancy, supervision, high-risk 02/20/2017    PLAN: Stable. Off glucose stabilizer. Will follow BS's F/U U/S on Monday Continue routine antenatal care.   Hermina StaggersMichael L Aprile Dickenson 05/31/2017,7:58 AM

## 2017-06-01 DIAGNOSIS — O365990 Maternal care for other known or suspected poor fetal growth, unspecified trimester, not applicable or unspecified: Secondary | ICD-10-CM

## 2017-06-01 LAB — GLUCOSE, CAPILLARY
GLUCOSE-CAPILLARY: 133 mg/dL — AB (ref 65–99)
GLUCOSE-CAPILLARY: 100 mg/dL — AB (ref 65–99)
GLUCOSE-CAPILLARY: 178 mg/dL — AB (ref 65–99)
Glucose-Capillary: 123 mg/dL — ABNORMAL HIGH (ref 65–99)
Glucose-Capillary: 136 mg/dL — ABNORMAL HIGH (ref 65–99)

## 2017-06-01 MED ORDER — INSULIN ASPART 100 UNIT/ML ~~LOC~~ SOLN
12.0000 [IU] | Freq: Every day | SUBCUTANEOUS | Status: DC
  Administered 2017-06-01 – 2017-06-06 (×6): 12 [IU] via SUBCUTANEOUS

## 2017-06-01 MED ORDER — INSULIN ASPART 100 UNIT/ML ~~LOC~~ SOLN
9.0000 [IU] | Freq: Every day | SUBCUTANEOUS | Status: DC
  Administered 2017-06-01 – 2017-06-05 (×5): 9 [IU] via SUBCUTANEOUS

## 2017-06-01 MED ORDER — INSULIN NPH (HUMAN) (ISOPHANE) 100 UNIT/ML ~~LOC~~ SUSP
9.0000 [IU] | Freq: Every day | SUBCUTANEOUS | Status: DC
  Administered 2017-06-01 – 2017-06-02 (×2): 9 [IU] via SUBCUTANEOUS
  Filled 2017-06-01: qty 10

## 2017-06-01 MED ORDER — INSULIN NPH (HUMAN) (ISOPHANE) 100 UNIT/ML ~~LOC~~ SUSP
20.0000 [IU] | Freq: Every day | SUBCUTANEOUS | Status: DC
  Administered 2017-06-01 – 2017-06-02 (×2): 20 [IU] via SUBCUTANEOUS

## 2017-06-01 NOTE — Progress Notes (Signed)
Intermittent tracing on Baby B from 1852 to 1908.  Adjusted monitor at 1908.

## 2017-06-01 NOTE — Progress Notes (Signed)
Pt states she doesn't want to be put on the monitor until her visitors are gone.

## 2017-06-02 ENCOUNTER — Inpatient Hospital Stay (HOSPITAL_COMMUNITY): Payer: Medicaid Other

## 2017-06-02 DIAGNOSIS — Z3A29 29 weeks gestation of pregnancy: Secondary | ICD-10-CM

## 2017-06-02 LAB — GLUCOSE, CAPILLARY
GLUCOSE-CAPILLARY: 108 mg/dL — AB (ref 65–99)
Glucose-Capillary: 117 mg/dL — ABNORMAL HIGH (ref 65–99)
Glucose-Capillary: 144 mg/dL — ABNORMAL HIGH (ref 65–99)

## 2017-06-02 NOTE — Progress Notes (Signed)
Patient ID: Donna Bellowsarita Fiero, female   DOB: [birth year], 21 y.o.   MRN: [id number]  FACULTY PRACTICE ANTEPARTUM(COMPREHENSIVE) NOTE  Donna Chen is a 21 y.o. G1P0 at 4767w1d by best clinical estimate who is admitted for IUGR and AEDF.   Fetal presentation is cephalic and Breech. Length of Stay:  5  Days  Subjective: Denies complaints today. Patient reports the fetal movement as active. Patient reports uterine contraction  activity as none. Patient reports  vaginal bleeding as none. Patient describes fluid per vagina as None.  Vitals:  Blood pressure 110/65, pulse 94, temperature 98.7 F (37.1 C), temperature source Oral, resp. rate 18, height 4\' 9"  (1.448 m), weight 133 lb (60.3 kg), last menstrual period 11/10/2016, SpO2 100 %. Physical Examination:  General appearance - alert, well appearing, and in no distress Chest - normal effort Abdomen - gravid, NT Fundal Height:  consistent with twins Extremities: Homans sign is negative, no sign of DVT  Membranes:intact  Fetal Monitoring: A Baseline: 150 bpm, Variability: Good {> 6 bpm), Accelerations: Reactive and Decelerations: Absent  B Baseline: 145 bpm, Variability: Good {> 6 bpm), Accelerations: Reactive and Decelerations: Absent  Labs:  Results for orders placed or performed during the hospital encounter of 05/28/17 (from the past 24 hour(s))  Glucose, capillary   Collection Time: 06/01/17  4:12 PM  Result Value Ref Range   Glucose-Capillary 133 (H) 65 - 99 mg/dL  Glucose, capillary   Collection Time: 06/01/17  9:16 PM  Result Value Ref Range   Glucose-Capillary 100 (H) 65 - 99 mg/dL  Glucose, capillary   Collection Time: 06/01/17 11:23 PM  Result Value Ref Range   Glucose-Capillary 178 (H) 65 - 99 mg/dL   Comment 1 Notify RN    Comment 2 Document in Chart   Glucose, capillary   Collection Time: 06/02/17  6:14 AM  Result Value Ref Range   Glucose-Capillary 108 (H) 65 - 99 mg/dL  Glucose, capillary   Collection Time: 06/02/17  12:40 PM  Result Value Ref Range   Glucose-Capillary 117 (H) 65 - 99 mg/dL    Medications:  Scheduled . docusate sodium  100 mg Oral Daily  . insulin aspart  12 Units Subcutaneous Q breakfast  . insulin aspart  9 Units Subcutaneous QAC supper  . insulin NPH Human  20 Units Subcutaneous QAC breakfast  . insulin NPH Human  9 Units Subcutaneous QHS  . prenatal multivitamin  1 tablet Oral Q1200   I have reviewed the patient's current medications.  ASSESSMENT: Active Problems:   Twin pregnancy, twins discordant   Pregnancy affected by fetal growth restriction   Twin gestation in third trimester   Gestational diabetes   PLAN: CBGs elevated due to BMZ--continue insulin for now Weekly dopplers, daily testing for B with IUGR and abnormal dopplers Timing of delivery is unclear at present, but now later than 34 wks Patient with appointment for citizen test in [place] on 07/03/17.  Reva Boresanya S Chetan Mehring, MD 06/02/2017,2:09 PM

## 2017-06-02 NOTE — Progress Notes (Signed)
[id number]: Patient transported to Radiology for abdominal ultrasound.

## 2017-06-03 LAB — GLUCOSE, CAPILLARY
GLUCOSE-CAPILLARY: 154 mg/dL — AB (ref 65–99)
Glucose-Capillary: 166 mg/dL — ABNORMAL HIGH (ref 65–99)
Glucose-Capillary: 189 mg/dL — ABNORMAL HIGH (ref 65–99)

## 2017-06-03 LAB — TYPE AND SCREEN
ABO/RH(D): B POS
ANTIBODY SCREEN: NEGATIVE

## 2017-06-03 MED ORDER — INSULIN NPH (HUMAN) (ISOPHANE) 100 UNIT/ML ~~LOC~~ SUSP
24.0000 [IU] | Freq: Every day | SUBCUTANEOUS | Status: DC
  Administered 2017-06-03 – 2017-06-06 (×4): 24 [IU] via SUBCUTANEOUS

## 2017-06-03 MED ORDER — INSULIN NPH (HUMAN) (ISOPHANE) 100 UNIT/ML ~~LOC~~ SUSP: 24.0000 [IU] | Freq: Every day | SUBCUTANEOUS | Status: DC

## 2017-06-03 MED ORDER — INSULIN NPH (HUMAN) (ISOPHANE) 100 UNIT/ML ~~LOC~~ SUSP
12.0000 [IU] | Freq: Every day | SUBCUTANEOUS | Status: DC
  Administered 2017-06-03 – 2017-06-05 (×3): 12 [IU] via SUBCUTANEOUS

## 2017-06-03 NOTE — Plan of Care (Signed)
  Progressing Coping: Level of anxiety will decrease 06/03/2017 0246 - Progressing by Pietro CassisHollis, Vieva Brummitt A, RN Note Pt able to express her needs and ask questions to decrease anxiety level.  Explain plan of care and all procedures.

## 2017-06-03 NOTE — Progress Notes (Signed)
Patient ID: Donna Chen, female   DOB: [birth year], 21 y.o.   MRN: [id number] FACULTY PRACTICE ANTEPARTUM(COMPREHENSIVE) NOTE  Donna Chen is a 21 y.o. G1P0 at 4133w2d by best clinical estimate who is admitted for IUGR, abnl Dopplers of Twin B.   Fetal presentation is cephalic and breech. Length of Stay:  6  Days  Subjective: Reports good FM Patient reports the fetal movement as active. Patient reports uterine contraction  activity as none. Patient reports  vaginal bleeding as none. Patient describes fluid per vagina as None.  Vitals:  Blood pressure 108/73, pulse 85, temperature 98.6 F (37 C), temperature source Oral, resp. rate 20, height 4\' 9"  (1.448 m), weight 133 lb (60.3 kg), last menstrual period 11/10/2016, SpO2 100 %. Physical Examination:  General appearance - alert, well appearing, and in no distress Chest - nml effort Abdomen - Gravid NT Fundal Height:  size equals dates Extremities: Homans sign is negative, no sign of DVT  Membranes:intact  Fetal Monitoring:A  Baseline: 145 bpm, Variability: Good {> 6 bpm), Accelerations: Reactive and Decelerations: Absent B Baseline: 150 bpm, Variability: Good {> 6 bpm), Accelerations: Reactive and Decelerations: Absent Labs:  Results for orders placed or performed during the hospital encounter of 05/28/17 (from the past 24 hour(s))  Glucose, capillary   Collection Time: 06/02/17 12:40 PM  Result Value Ref Range   Glucose-Capillary 117 (H) 65 - 99 mg/dL  Glucose, capillary   Collection Time: 06/02/17  7:10 PM  Result Value Ref Range   Glucose-Capillary 144 (H) 65 - 99 mg/dL  Glucose, capillary   Collection Time: 06/03/17  5:45 AM  Result Value Ref Range   Glucose-Capillary 154 (H) 65 - 99 mg/dL     Medications:  Scheduled . docusate sodium  100 mg Oral Daily  . insulin aspart  12 Units Subcutaneous Q breakfast  . insulin aspart  9 Units Subcutaneous QAC supper  . insulin NPH Human  20 Units Subcutaneous QAC breakfast  .  insulin NPH Human  9 Units Subcutaneous QHS  . prenatal multivitamin  1 tablet Oral Q1200   I have reviewed the patient's current medications.  ASSESSMENT: Active Problems:   Twin pregnancy, twins discordant   Pregnancy affected by fetal growth restriction   Twin gestation in third trimester   Gestational diabetes   PLAN: CBGs are still too high--increase insulins Continue 3x/wk monitoring of abnl dopplers  Reva Boresanya S Ruhi Kopke, MD 06/03/2017,11:24 AM

## 2017-06-04 ENCOUNTER — Inpatient Hospital Stay (HOSPITAL_COMMUNITY): Payer: Medicaid Other

## 2017-06-04 DIAGNOSIS — Z3A39 39 weeks gestation of pregnancy: Secondary | ICD-10-CM

## 2017-06-04 LAB — GLUCOSE, CAPILLARY
GLUCOSE-CAPILLARY: 117 mg/dL — AB (ref 65–99)
GLUCOSE-CAPILLARY: 136 mg/dL — AB (ref 65–99)
GLUCOSE-CAPILLARY: 130 mg/dL — AB (ref 65–99)

## 2017-06-04 NOTE — Progress Notes (Signed)
Pt eating food brought to her from outside of the hospital. Pt educated on the importance of following the prescribe gestational carb modified diet. Pt verbalized an understanding. Carmelina DaneERRI L Taya Ashbaugh, RN

## 2017-06-04 NOTE — Progress Notes (Signed)
Patient ID: Donna Chen, female   DOB: March 13, 1996, 21 y.o.   MRN: [id number]  FACULTY PRACTICE ANTEPARTUM(COMPREHENSIVE) NOTE  Donna Bellowsarita Porro is a 21 y.o. G1P0 at 3773w3d by best clinical estimate who is admitted for IUGR with abnl Dopplers of twin B.   Fetal presentation is cephalic and breech. Length of Stay:  7  Days  Subjective: Feels well, wants to go home--needs to take care of family Patient reports the fetal movement as active. Patient reports uterine contraction  activity as none. Patient reports  vaginal bleeding as none. Patient describes fluid per vagina as None.  Vitals:  Blood pressure 118/80, pulse 95, temperature 97.9 F (36.6 C), temperature source Oral, resp. rate 18, height 4\' 9"  (1.448 m), weight 133 lb (60.3 kg), last menstrual period 11/10/2016, SpO2 99 %. Physical Examination:  General appearance - alert, well appearing, and in no distress Chest - normal effort Abdomen - gravid, NT Fundal Height:  size equals dates Extremities: Homans sign is negative, no sign of DVT  Membranes:intact  Fetal Monitoring: A Baseline: 140 bpm, Variability: Good {> 6 bpm), Accelerations: Reactive and Decelerations: Absent  B Baseline: 155 bpm, Variability: Good {> 6 bpm), Accelerations: Reactive and Decelerations: Absent  Labs:  Results for orders placed or performed during the hospital encounter of 05/28/17 (from the past 24 hour(s))  Glucose, capillary   Collection Time: 06/03/17  2:02 PM  Result Value Ref Range   Glucose-Capillary 166 (H) 65 - 99 mg/dL  Type and screen [facility]   Collection Time: 06/03/17  4:47 PM  Result Value Ref Range   ABO/RH(D) B POS    Antibody Screen NEG    Sample Expiration 06/06/2017   Glucose, capillary   Collection Time: 06/03/17  8:28 PM  Result Value Ref Range   Glucose-Capillary 189 (H) 65 - 99 mg/dL  Glucose, capillary   Collection Time: 06/04/17  6:31 AM  Result Value Ref Range   Glucose-Capillary 117 (H) 65 - 99  mg/dL    Medications:  Scheduled . docusate sodium  100 mg Oral Daily  . insulin aspart  12 Units Subcutaneous Q breakfast  . insulin aspart  9 Units Subcutaneous QAC supper  . insulin NPH Human  12 Units Subcutaneous QHS  . insulin NPH Human  24 Units Subcutaneous QAC breakfast  . prenatal multivitamin  1 tablet Oral Q1200   I have reviewed the patient's current medications.  ASSESSMENT: Active Problems:   Twin pregnancy, twins discordant   Pregnancy affected by fetal growth restriction   Twin gestation in third trimester   Gestational diabetes   PLAN: For f/u u/s today for Dopplers Continue current management. CBG's improving with increasing NPH   Reva Boresanya S Carthel Castille, MD 06/04/2017,11:44 AM

## 2017-06-05 LAB — GLUCOSE, CAPILLARY
GLUCOSE-CAPILLARY: 125 mg/dL — AB (ref 65–99)
GLUCOSE-CAPILLARY: 138 mg/dL — AB (ref 65–99)
GLUCOSE-CAPILLARY: 206 mg/dL — AB (ref 65–99)
Glucose-Capillary: 158 mg/dL — ABNORMAL HIGH (ref 65–99)
Glucose-Capillary: 158 mg/dL — ABNORMAL HIGH (ref 65–99)

## 2017-06-05 MED ORDER — INSULIN ASPART 100 UNIT/ML ~~LOC~~ SOLN: 11.0000 [IU] | Freq: Every day | SUBCUTANEOUS | Status: DC

## 2017-06-05 NOTE — Progress Notes (Signed)
Inpatient Diabetes Program Recommendations  Diabetes Treatment Program Recommendations  ADA Standards of Care 2018 Diabetes in Pregnancy Target Glucose Ranges:  Fasting: 60 - 90 mg/dL Preprandial: 60 - 098105 mg/dL 1 hr postprandial: Less than 140mg /dL (from first bite of meal) 2 hr postprandial: Less than 120 mg/dL (from first bite of meal)   Results for [name], Nou (MRN [id number]) as of 06/05/2017 09:14  Ref. Range 06/04/2017 06:31 06/04/2017 13:02 06/04/2017 21:03 06/05/2017 06:21  Glucose-Capillary Latest Ref Range: 65 - 99 mg/dL 562117 (H) 130136 (H) 865130 (H) 125 (H)    Review of Glycemic Control  Current orders for Inpatient glycemic control: NPH 24 units QAM, NPH 12 units QHS, Novolog 12 units with breakfast, Novolog 9 units with supper  Inpatient Diabetes Program Recommendations: Insulin - Basal: Please consider increasing NPH to 25 units QAM and NPH 14 units QHS.  Thanks, Orlando PennerMarie Davina Howlett, RN, MSN, CDE Diabetes Coordinator Inpatient Diabetes Program [phone] (Team Pager from 8am to 5pm)

## 2017-06-05 NOTE — Progress Notes (Signed)
Patient ID: Donna Chen, female   DOB: 28-Jan-1996, 21 y.o.   MRN: [id number] FACULTY PRACTICE ANTEPARTUM(COMPREHENSIVE) NOTE  Donna Chen is a 21 y.o. G1P0 at 316w4d by best clinical estimate who is admitted for IUGR.   Fetal presentation is cephalic and breech. Length of Stay:  8  Days  Subjective: Patient is without complaint today Patient reports the fetal movement as active. Patient reports uterine contraction  activity as none. Patient reports  vaginal bleeding as none. Patient describes fluid per vagina as None.  Vitals:  Blood pressure 123/84, pulse 97, temperature 98.3 F (36.8 C), temperature source Oral, resp. rate 16, height 4\' 9"  (1.448 m), weight 128 lb (58.1 kg), last menstrual period 11/10/2016, SpO2 99 %. Physical Examination:  General appearance - alert, well appearing, and in no distress Chest - normal effort Abdomen - soft, nontender, nondistended, no masses or organomegaly Fundal Height:  size equals dates Extremities: Homans sign is negative, no sign of DVT  Membranes:intact  Fetal Monitoring: A Baseline: 145 bpm, Variability: Good {> 6 bpm), Accelerations: Reactive and Decelerations: Absent B Baseline: 135 bpm, Variability: Good {> 6 bpm), Accelerations: Reactive and Decelerations: Absent Labs:  Results for orders placed or performed during the hospital encounter of 05/28/17 (from the past 24 hour(s))  Glucose, capillary   Collection Time: 06/04/17  9:03 PM  Result Value Ref Range   Glucose-Capillary 130 (H) 65 - 99 mg/dL  Glucose, capillary   Collection Time: 06/05/17  6:21 AM  Result Value Ref Range   Glucose-Capillary 125 (H) 65 - 99 mg/dL  Glucose, capillary   Collection Time: 06/05/17 12:01 PM  Result Value Ref Range   Glucose-Capillary 138 (H) 65 - 99 mg/dL   Comment 1 Notify RN    Comment 2 Document in Chart     Medications:  Scheduled . docusate sodium  100 mg Oral Daily  . insulin aspart  12 Units Subcutaneous Q breakfast  . insulin aspart   9 Units Subcutaneous QAC supper  . insulin NPH Human  12 Units Subcutaneous QHS  . insulin NPH Human  24 Units Subcutaneous QAC breakfast  . prenatal multivitamin  1 tablet Oral Q1200   I have reviewed the patient's current medications.  ASSESSMENT: Active Problems:   Twin pregnancy, twins discordant   Pregnancy affected by fetal growth restriction   Twin gestation in third trimester   Gestational diabetes   PLAN: CBGs are much improved over last few days--steroid effect wearing off, may need decreasing insulin doses soon Dopplers tomorrow--NST's remain reactive.  Reva Boresanya S Delshon Blanchfield, MD 06/05/2017,1:46 PM

## 2017-06-06 ENCOUNTER — Inpatient Hospital Stay (HOSPITAL_COMMUNITY): Payer: Medicaid Other

## 2017-06-06 LAB — GLUCOSE, CAPILLARY
Glucose-Capillary: 114 mg/dL — ABNORMAL HIGH (ref 65–99)
Glucose-Capillary: 147 mg/dL — ABNORMAL HIGH (ref 65–99)

## 2017-06-06 MED ORDER — MAGNESIUM SULFATE BOLUS VIA INFUSION
6.0000 g | Freq: Once | INTRAVENOUS | Status: AC
  Administered 2017-06-06: 6 g via INTRAVENOUS
  Filled 2017-06-06: qty 500

## 2017-06-06 MED ORDER — MAGNESIUM SULFATE 40 G IN LACTATED RINGERS - SIMPLE
2.0000 g/h | INTRAVENOUS | Status: DC
  Filled 2017-06-06: qty 500

## 2017-06-06 MED ORDER — LACTATED RINGERS IV SOLN
INTRAVENOUS | Status: DC
  Administered 2017-06-06: 11:00:00 via INTRAVENOUS

## 2017-06-06 NOTE — Discharge Instructions (Signed)
Intrauterine Growth Restriction Intrauterine growth restriction (IUGR) is when your baby is not growing normally during your pregnancy. A baby with IUGR is smaller than it should be and may weigh less than normal at birth. IUGR can result if there is a problem with the organ that supplies your baby with oxygen and nutrition (placenta). Usually, there is no way to prevent this type of problem. What are the causes? The most common cause of IUGR is a problem with the placenta or umbilical cord that causes your developing baby to get less oxygen or nutrition than needed. Other causes include:  Poor maternal nutrition.  Chemicals found in substances such as cigarettes, alcohol, and some illegal drugs.  Some prescription medicines.  Congenital defects.  Genetic disorders.  An infection.  Carrying more than one baby, such as twins or triplets (multiple gestations).  What increases the risk? This condition is more likely to develop in women who:  Are over the age of 35 at the time of delivery (advanced maternal age).  Have medical conditions such as high blood pressure, diabetes, heart or kidney disease, anemia, or conditions that increase the risk for blood clotting.  Live at a very high altitude during pregnancy.  Have a personal history or family history of IUGR.  Take medicines during pregnancy that are linked with congenital disabilities.  Have a personal or family history of a genetic disorder.  Come into contact with infected cat feces (toxoplasmosis).  Come into contact with chickenpox (varicella) or German measles (rubella).  Have or are at risk of getting an infectious disease such as syphilis, HIV, or herpes.  Eat poorly during their pregnancy.  Weigh less than 100 pounds.  Have a family history of multiple gestations.  Have had infertility treatments.  Use tobacco, illegal drugs, or drink alcohol during pregnancy.  What are the signs or symptoms? IUGR does not  cause many symptoms. You might notice that your baby does not move or kick very often. Also, your belly may not be as big as expected for the stage of your pregnancy. How is this diagnosed? This condition is diagnosed with physical and prenatal exams. Your health care provider will measure the size of your baby inside your womb during a routine screening exam using sound waves (ultrasound). Your health care provider will compare the size of your baby to the size of other babies at the same stage of development (gestational age). Your health care provider will diagnose IUGR if your baby is smaller than 90 percent of all other babies at the same gestational age. You may also have tests to find the cause of IUGR. These can include:  Having fluid removed from your womb to check for signs of infection or a congenital disability (amniocentesis).  A series of tests to monitor your baby's health (fetal monitoring).  How is this treated? In most cases, treatment for this condition focuses on stopping the cause of your baby's small growth. Your health care providers will monitor your pregnancy closely and help you manage your pregnancy. If this condition is caused by a placenta problem and the baby is not getting enough blood, treatment may include:  Medicine to start labor and deliver your baby early (induction).  Cesarean delivery. In this procedure, your baby is delivered through a cut (incision) in the abdomen and womb (uterus).  Follow these instructions at home:  Make sure you are eating enough calories and gaining enough weight.  Eat a balanced diet. Work with a nutrition specialist (  dietitian), if needed.  Rest as needed. Try to get at least eight hours of sleep every night.  Do not drink alcohol.  Do not use illegal drugs.  Do not use any tobacco products, including cigarettes, chewing tobacco, or e-cigarettes. If you need help quitting, ask your health care provider.  Talk to your  health care provider about steps you can take to avoid infections.  Take medicines, vitamins, and mineral supplements only as directed by your health care provider. Make sure that your health care provider knows about all of the prescribed or over-the-counter medicines, supplements, vitamins, eye drops, and creams that you are using.  Keep all follow-up visits as directed by your health care provider. This is important. Contact a health care provider if:  Your baby is not moving as often as before. This information is not intended to replace advice given to you by your health care provider. Make sure you discuss any questions you have with your health care provider. Document Released: 03/26/2008 Document Revised: 11/23/2015 Document Reviewed: 06/13/2014 Elsevier Interactive Patient Education  2018 Elsevier Inc.  

## 2017-06-06 NOTE — Progress Notes (Signed)
Magnesium Sulfate 6 gram bolus infusing. Fetal heart rates stable. Baby A 155. Baby B 150. Carmelina DaneERRI L Anil Havard, RN

## 2017-06-06 NOTE — Progress Notes (Signed)
Magnesium sulfate infusing @ 2 grams per hour. Carmelina DaneERRI L Sheron Robin, RN

## 2017-06-06 NOTE — Progress Notes (Signed)
Pt transferred via [facility] in stable condition. Report given to Ivory Broadeleste Chacon[facility]. FHR on both babies stable. Magnesium Sulfate infusing @ 2gm per hour. No vaginal bleeding or leaking of fluid. Pt without complaint of pain. Carmelina DaneERRI L Xandra Laramee, RN

## 2017-06-06 NOTE — Discharge Summary (Signed)
Antenatal Physician Discharge Summary  Patient ID: Donna Chen MRN: [id number] DOB/AGE: 03/01/1996 21 y.o.  Admit date: 05/28/2017 Discharge date: 06/06/2017  Admission Diagnoses:Principal Problem:   Twin pregnancy, twins discordant Active Problems:   Pregnancy affected by fetal growth restriction   Twin gestation in third trimester   Gestational diabetes   Discharge Diagnoses: Same  Prenatal Procedures: NST and ultrasound  Consults: Maternal Fetal Medicine  Hospital Course:  This is a 21 y.o. G1P0 with MC/DA TIUP at 4562w5d admitted for FGR and AEDF with sometimes REDF in twin B. There is no TTTS as baby A is appropriately grown and there is no poly/oli.  Previously admitted with same from 11/13-11/16 (diagnosed with GDM here with 1 hour of 244) and on 11/23-11/26. No leaking of fluid and no bleeding.  She received second course of betamethasone x 2 doses on 11/28-11/29.  First course on 11/13-11/14.She was observed, fetal heart rate monitoring remained reassuring. She got repeat Dopplers 3x/wk per MFM and on day of transfer was noted to have persistent REDF and they asked for delivery. The NICU is full at present and unable to handle these babies. [facility]. She was given 6 gm load and 2gm/hour of magnesium for neuroprotection. Diabetic control has been done with insulin and she has required increasing doses of insulin due to dietary discretion and BMZ effect. [facility]. Normal prenatal labs. Babies are Vtx/Breech  Discharge Exam: Temp:  [97.9 F (36.6 C)-98.3 F (36.8 C)] 98 F (36.7 C) (12/07 0623) Pulse Rate:  [87-97] 87 (12/07 0623) Resp:  [15-18] 18 (12/07 0623) BP: (108-124)/(70-85) 108/70 (12/07 0623) SpO2:  [98 %-99 %] 99 % (12/07 0623) Weight:  [128 lb 8 oz (58.3 kg)] 128 lb 8 oz (58.3 kg) (12/07 84690623) Physical Examination: CONSTITUTIONAL: Well-developed, well-nourished female in no acute distress.   HENT:  Normocephalic, atraumatic. Oropharynx is clear and moist EYES: Conjunctivae and EOM are normal. No scleral icterus.  NECK: Normal range of motion, supple, no masses SKIN: Skin is warm and dry. No rash noted.  NEUROLGIC: Alert and oriented to person, place, and time. Normal reflexes, muscle tone coordination. No cranial nerve deficit noted. PSYCHIATRIC: Normal mood and affect. Normal behavior. Normal judgment and thought content. CARDIOVASCULAR: Normal heart rate noted, regular rhythm RESPIRATORY: Effort and breath sounds normal MUSCULOSKELETAL: Normal range of motion. No edema and no tenderness. 2+ distal pulses. ABDOMEN: Soft, nontender, nondistended, gravid.  Fetal monitoring: A FHR: 150 bpm, Variability: moderate, Accelerations: Present, Decelerations: Absent  B  FHR: 145 bpm, Variability: moderate, Accelerations: Present, Decelerations: Absent        Significanr Lab Values: HgbA1C 6.6 Most recent CBGs 147/114/158/206/138/125/130/136  ABO, Rh: --/--/B POS (12/04 1647) Antibody: NEG (12/04 1647) Rubella: 2.06 (08/23 1023) RPR: Non Reactive (11/28 1240) HBsAg:  Lab Results  Component Value Date   HEPBSAG Negative 02/20/2017   HIV:  Non-reactive    Discharge Condition: Stable  Disposition: 01-Home or Self Care    Allergies as of 06/06/2017   No Known Allergies     Medication List    STOP taking these medications   aspirin EC 81 MG tablet     TAKE these medications   insulin aspart 100 UNIT/ML injection Commonly known as:  novoLOG Inject 9 Units into the skin daily with supper.   insulin aspart 100 UNIT/ML injection Commonly known as:  novoLOG Inject 12 Units into the skin daily with breakfast.   insulin NPH Human 100  UNIT/ML injection Commonly known as:  HUMULIN N,NOVOLIN N Inject 0.12 mLs (12 Units total) into the skin at bedtime.   insulin NPH Human 100 UNIT/ML injection Commonly known as:  HUMULIN N,NOVOLIN N Inject 0.2 mLs (20 Units total)  into the skin daily before breakfast.   Prenatal Vitamins 28-0.8 MG Tabs Take 1 tablet by mouth daily.     Magnesium Sulfate 2 gm/hour continuous  IV infusion  Follow-up Information    [facility]   Specialty:  Obstetrics and Gynecology Why:  for postpartum check--they will call you with an appointment Contact information: [address] [place] [id number] [phone]          Signed: Reva Boresanya S Pratt M.D. 06/06/2017, 11:49 AM

## 2017-06-16 ENCOUNTER — Ambulatory Visit: Payer: Medicaid Other | Admitting: *Deleted

## 2017-06-16 VITALS — BP 115/72 | HR 97 | Temp 97.6°F

## 2017-06-16 DIAGNOSIS — Z4889 Encounter for other specified surgical aftercare: Secondary | ICD-10-CM

## 2017-06-16 NOTE — Progress Notes (Signed)
Pt presents to clinic for wound check [facility]. VS WNL, Low transverse incision is covered by a strip of paper tape. Pt stated she was instructed to allow this tape to fall off on its own. There is no visible drainage, erythema or edema. Instructed patient on signs of infection previously mentioned and to come in if these are observed. Understanding voiced.

## 2017-07-09 ENCOUNTER — Encounter: Payer: Self-pay | Admitting: Nurse Practitioner

## 2017-07-09 ENCOUNTER — Ambulatory Visit (INDEPENDENT_AMBULATORY_CARE_PROVIDER_SITE_OTHER): Payer: Medicaid Other | Admitting: Nurse Practitioner

## 2017-07-09 ENCOUNTER — Other Ambulatory Visit (HOSPITAL_COMMUNITY)
Admission: RE | Admit: 2017-07-09 | Discharge: 2017-07-09 | Disposition: A | Discharge status: Home / Self Care | Payer: Medicaid Other | Source: Ambulatory Visit | Attending: Obstetrics & Gynecology | Admitting: Obstetrics & Gynecology

## 2017-07-09 VITALS — BP 116/81 | HR 76 | Ht <= 58 in | Wt 119.9 lb

## 2017-07-09 DIAGNOSIS — Z30013 Encounter for initial prescription of injectable contraceptive: Secondary | ICD-10-CM | POA: Diagnosis not present

## 2017-07-09 DIAGNOSIS — Z3202 Encounter for pregnancy test, result negative: Secondary | ICD-10-CM | POA: Diagnosis not present

## 2017-07-09 DIAGNOSIS — Z1389 Encounter for screening for other disorder: Secondary | ICD-10-CM | POA: Diagnosis not present

## 2017-07-09 DIAGNOSIS — O24414 Gestational diabetes mellitus in pregnancy, insulin controlled: Secondary | ICD-10-CM

## 2017-07-09 DIAGNOSIS — Z3042 Encounter for surveillance of injectable contraceptive: Secondary | ICD-10-CM | POA: Diagnosis present

## 2017-07-09 LAB — POCT URINALYSIS DIP (DEVICE)
BILIRUBIN URINE: NEGATIVE
Glucose, UA: NEGATIVE mg/dL
KETONES UR: NEGATIVE mg/dL
Leukocytes, UA: NEGATIVE
Nitrite: NEGATIVE
Protein, ur: NEGATIVE mg/dL
Urobilinogen, UA: 0.2 mg/dL (ref 0.0–1.0)
pH: 6 (ref 5.0–8.0)

## 2017-07-09 LAB — POCT PREGNANCY, URINE: PREG TEST UR: NEGATIVE

## 2017-07-09 MED ORDER — MEDROXYPROGESTERONE ACETATE 150 MG/ML IM SUSP
150.0000 mg | INTRAMUSCULAR | Status: AC
  Administered 2017-07-09 – 2017-12-24 (×3): 150 mg via INTRAMUSCULAR

## 2017-07-09 NOTE — Progress Notes (Signed)
Subjective:     Donna Chen is a 22 y.o. female who presents for a postpartum visit. She is 4 weeks postpartum following a low cervical transverse Cesarean section. I have fully reviewed the prenatal and intrapartum course. The delivery was at 29 gestational weeks. Outcome: primary cesarean section, low transverse incision. Anesthesia: spinal. Postpartum course has been uneventful - she stopped the insulin she was taking in pregnancy.. Babies are still in [place]ICU in [place] but gaining weight. Baby is feeding by breast. Client is pumping and providing breat mild to the NICU.  [facility] and reports no problems with her breasts.  Pumping 4 times a day and advised to possibly pump 5 or 6 times a day to get more milk.  Bleeding red. Bowel function is normal. Bladder function is normal. Patient is not sexually active. Contraception method is Depo-Provera injections. Postpartum depression screening: negative.   Will start Depo shot today and will do pap as client is now 22 years of age and has not had a pap smear.  Has not yet removed the tape from over her C/S scar.  The following portions of the patient's history were reviewed and updated as appropriate: allergies, current medications, past family history, past medical history, past social history, past surgical history and problem list.  Review of Systems Pertinent items noted in HPI and remainder of comprehensive ROS otherwise negative.   Objective:    BP 116/81   Pulse 76   Ht 4\' 9"  (1.448 m)   Wt 119 lb 14.4 oz (54.4 kg)   LMP 11/10/2016 (Exact Date)   Breastfeeding? Yes Comment: Babies are in NICU,so use pump  BMI 25.95 kg/m   General:  alert, cooperative, appears stated age and talkative asking appropriate questions and in no distress.   Breasts:  negative  Lungs: clear to auscultation bilaterally  Heart:  regular rate and rhythm  Abdomen: soft, nontender, and tape removed from C/S scar - incision fully closed and no signs of  infection.   Vulva:  normal  Vagina: no erythema - small dark red stringy clot coming from cervix and extending into the vagina  Cervix:  no bleeding following Pap and no lesions  Corpus: normal size, contour, position, consistency, mobility, non-tender  Adnexa:  no mass, fullness, tenderness  Rectal Exam: Not performed.        Assessment:    Normal postpartum exam. Pap smear done at today's visit.   Encounter to begin Depo  Plan:    1. Contraception: Depo-Provera injections 2. No sex or sex with a condom only for the next 2 weeks until Depo is active. 3. Follow up in: 3 months or as needed.   4.  Will return in one week for fasting 2 hr glucola.

## 2017-07-09 NOTE — Patient Instructions (Addendum)
Get condoms at [address] [facility]   Use condoms for 2 weeks.  Medroxyprogesterone injection [Contraceptive] What is this medicine? MEDROXYPROGESTERONE (me DROX ee proe JES te rone) contraceptive injections prevent pregnancy. They provide effective birth control for 3 months. Depo-subQ Provera 104 is also used for treating pain related to endometriosis. This medicine may be used for other purposes; ask your health care provider or pharmacist if you have questions. COMMON BRAND NAME(S): Depo-Provera, Depo-subQ Provera 104 What should I tell my health care provider before I take this medicine? They need to know if you have any of these conditions: -frequently drink alcohol -asthma -blood vessel disease or a history of a blood clot in the lungs or legs -bone disease such as osteoporosis -breast cancer -diabetes -eating disorder (anorexia nervosa or bulimia) -high blood pressure -HIV infection or AIDS -kidney disease -liver disease -mental depression -migraine -seizures (convulsions) -stroke -tobacco smoker -vaginal bleeding -an unusual or allergic reaction to medroxyprogesterone, other hormones, medicines, foods, dyes, or preservatives -pregnant or trying to get pregnant -breast-feeding How should I use this medicine? Depo-Provera Contraceptive injection is given into a muscle. Depo-subQ Provera 104 injection is given under the skin. These injections are given by a health care professional. You must not be pregnant before getting an injection. The injection is usually given during the first 5 days after the start of a menstrual period or 6 weeks after delivery of a baby. Talk to your pediatrician regarding the use of this medicine in children. Special care may be needed. These injections have been used in female children who have started having menstrual periods. Overdosage: If you think you have taken too much of this medicine contact a  poison control center or emergency room at once. NOTE: This medicine is only for you. Do not share this medicine with others. What if I miss a dose? Try not to miss a dose. You must get an injection once every 3 months to maintain birth control. If you cannot keep an appointment, call and reschedule it. If you wait longer than 13 weeks between Depo-Provera contraceptive injections or longer than 14 weeks between Depo-subQ Provera 104 injections, you could get pregnant. Use another method for birth control if you miss your appointment. You may also need a pregnancy test before receiving another injection. What may interact with this medicine? Do not take this medicine with any of the following medications: -bosentan This medicine may also interact with the following medications: -aminoglutethimide -antibiotics or medicines for infections, especially rifampin, rifabutin, rifapentine, and griseofulvin -aprepitant -barbiturate medicines such as phenobarbital or primidone -bexarotene -carbamazepine -medicines for seizures like ethotoin, felbamate, oxcarbazepine, phenytoin, topiramate -modafinil -St. John's wort This list may not describe all possible interactions. Give your health care provider a list of all the medicines, herbs, non-prescription drugs, or dietary supplements you use. Also tell them if you smoke, drink alcohol, or use illegal drugs. Some items may interact with your medicine. What should I watch for while using this medicine? This drug does not protect you against HIV infection (AIDS) or other sexually transmitted diseases. Use of this product may cause you to lose calcium from your bones. Loss of calcium may cause weak bones (osteoporosis). Only use this product for more than 2 years if other forms of birth control are not right for you. The longer you use this product for birth control the more likely you will be at risk for weak bones. Ask your health care professional how  you can  keep strong bones. You may have a change in bleeding pattern or irregular periods. Many females stop having periods while taking this drug. If you have received your injections on time, your chance of being pregnant is very low. If you think you may be pregnant, see your health care professional as soon as possible. Tell your health care professional if you want to get pregnant within the next year. The effect of this medicine may last a long time after you get your last injection. What side effects may I notice from receiving this medicine? Side effects that you should report to your doctor or health care professional as soon as possible: -allergic reactions like skin rash, itching or hives, swelling of the face, lips, or tongue -breast tenderness or discharge -breathing problems -changes in vision -depression -feeling faint or lightheaded, falls -fever -pain in the abdomen, chest, groin, or leg -problems with balance, talking, walking -unusually weak or tired -yellowing of the eyes or skin Side effects that usually do not require medical attention (report to your doctor or health care professional if they continue or are bothersome): -acne -fluid retention and swelling -headache -irregular periods, spotting, or absent periods -temporary pain, itching, or skin reaction at site where injected -weight gain This list may not describe all possible side effects. Call your doctor for medical advice about side effects. You may report side effects to FDA at 1-800-FDA-1088. Where should I keep my medicine? This does not apply. The injection will be given to you by a health care professional. NOTE: This sheet is a summary. It may not cover all possible information. If you have questions about this medicine, talk to your doctor, pharmacist, or health care provider.  2018 Elsevier/Gold Standard (2008-07-08 18:37:56)

## 2017-07-11 LAB — CYTOLOGY - PAP: DIAGNOSIS: NEGATIVE

## 2017-07-22 ENCOUNTER — Telehealth: Payer: Self-pay | Admitting: Family Medicine

## 2017-07-22 NOTE — Telephone Encounter (Signed)
Patient called to say she was not able to make her appointment today. When I asked her when could she come in and take the test, she stated she would call later.

## 2017-07-23 ENCOUNTER — Other Ambulatory Visit: Payer: Medicaid Other

## 2017-09-03 ENCOUNTER — Other Ambulatory Visit: Payer: Self-pay | Admitting: General Practice

## 2017-09-03 ENCOUNTER — Other Ambulatory Visit: Payer: Medicaid Other

## 2017-09-03 ENCOUNTER — Other Ambulatory Visit: Payer: Self-pay

## 2017-09-03 DIAGNOSIS — O24439 Gestational diabetes mellitus in the puerperium, unspecified control: Secondary | ICD-10-CM

## 2017-09-04 LAB — GLUCOSE TOLERANCE, 2 HOURS
GLUCOSE, 2 HOUR: 153 mg/dL — AB (ref 65–139)
Glucose, GTT - Fasting: 94 mg/dL (ref 65–99)

## 2017-09-24 ENCOUNTER — Ambulatory Visit (INDEPENDENT_AMBULATORY_CARE_PROVIDER_SITE_OTHER): Payer: Medicaid Other

## 2017-09-24 DIAGNOSIS — Z3042 Encounter for surveillance of injectable contraceptive: Secondary | ICD-10-CM | POA: Diagnosis not present

## 2017-09-24 NOTE — Progress Notes (Signed)
Pt presented to the office for a Depo injection. Pt tolerated well. Next injection is due June 12-June 26.

## 2017-09-26 NOTE — Progress Notes (Signed)
I reviewed the note and agree with the nursing assessment and plan.   Tzivia Oneil, CNM 09/26/2017 10:32 AM   

## 2017-12-10 ENCOUNTER — Ambulatory Visit: Payer: Medicaid Other

## 2017-12-17 ENCOUNTER — Ambulatory Visit: Payer: Medicaid Other | Admitting: Obstetrics & Gynecology

## 2017-12-24 ENCOUNTER — Ambulatory Visit (INDEPENDENT_AMBULATORY_CARE_PROVIDER_SITE_OTHER): Payer: Medicaid Other | Admitting: *Deleted

## 2017-12-24 VITALS — BP 135/77 | HR 87

## 2017-12-24 DIAGNOSIS — Z3042 Encounter for surveillance of injectable contraceptive: Secondary | ICD-10-CM

## 2017-12-26 NOTE — Progress Notes (Signed)
Depo given today.  Reviewed the plan and agree with the care provided today. Annual exam due in January 2020  Nolene BernheimERRI Aveena Bari, RN, MSN, NP-BC Nurse Practitioner, [facility], [facility] 12/26/2017 3:28 PM

## 2018-03-11 ENCOUNTER — Ambulatory Visit: Payer: Medicaid Other

## 2018-10-25 IMAGING — US US MFM UA ADDL GEST
1 series · 14 of 28 positions shown · non-contrast
Comparison: none

[Series 1: us mfm ua addl gest · 78 acquisitions, 14 frames shown]
[im 3/78]
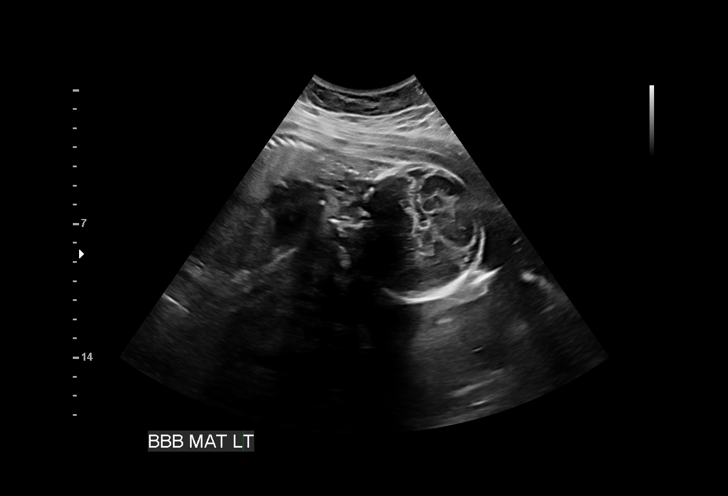
[im 9/78]
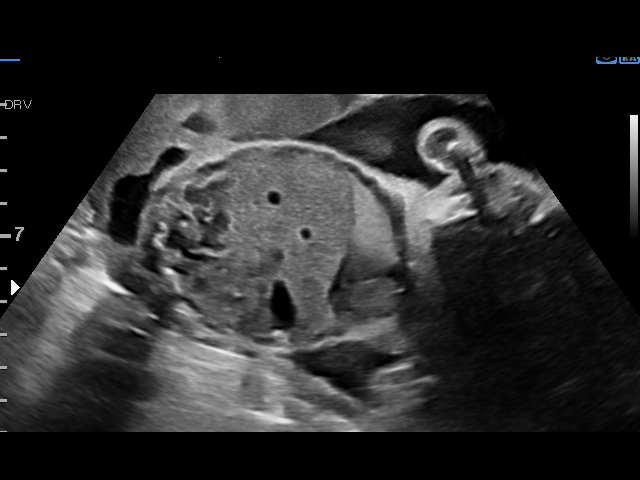
[im 15/78]
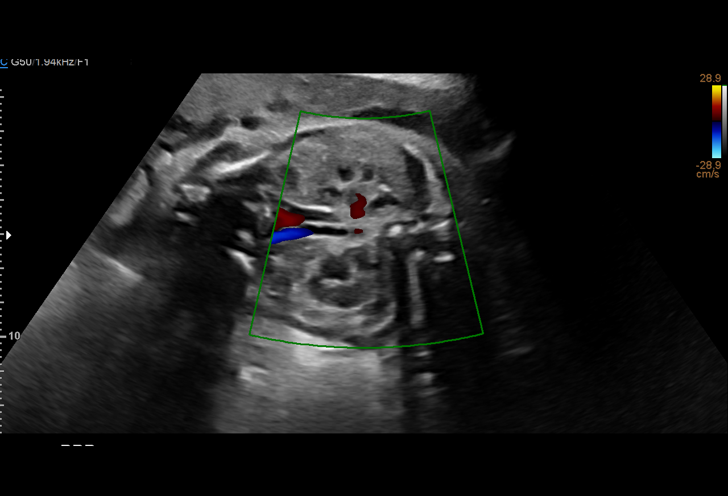
[im 20/78]
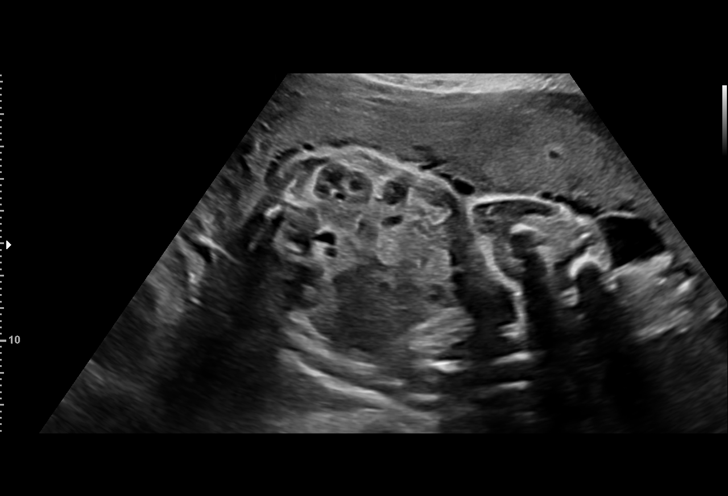
[im 26/78]
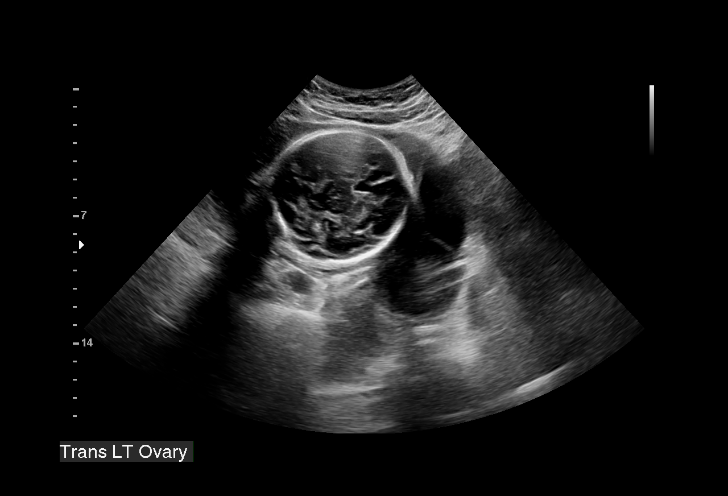
[im 32/78]
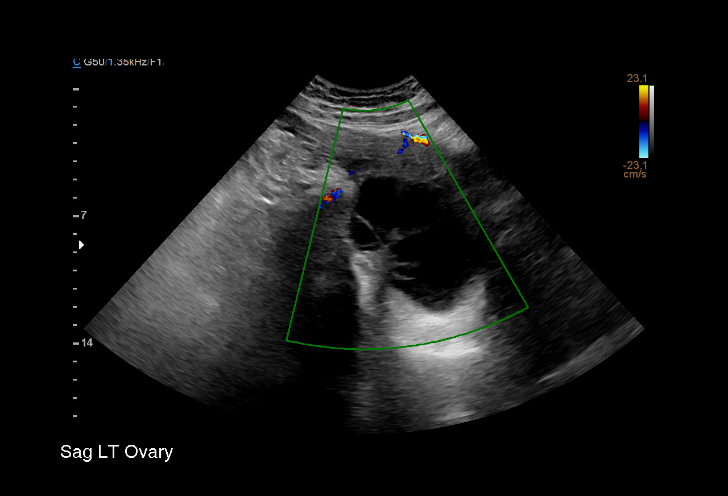
[im 38/78]
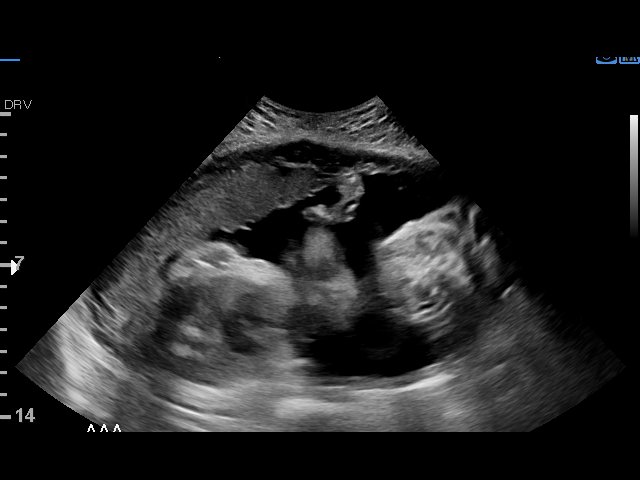
[im 43/78]
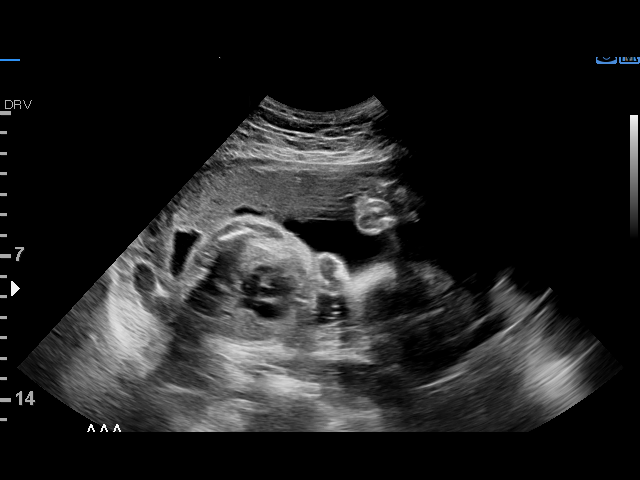
[im 49/78]
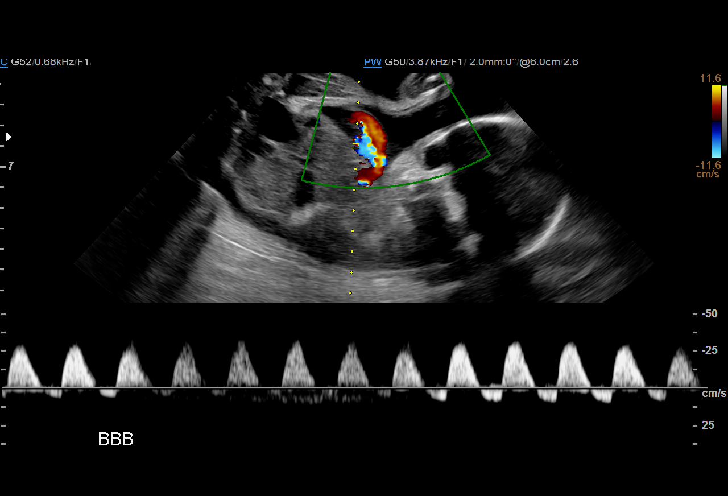
[im 55/78]
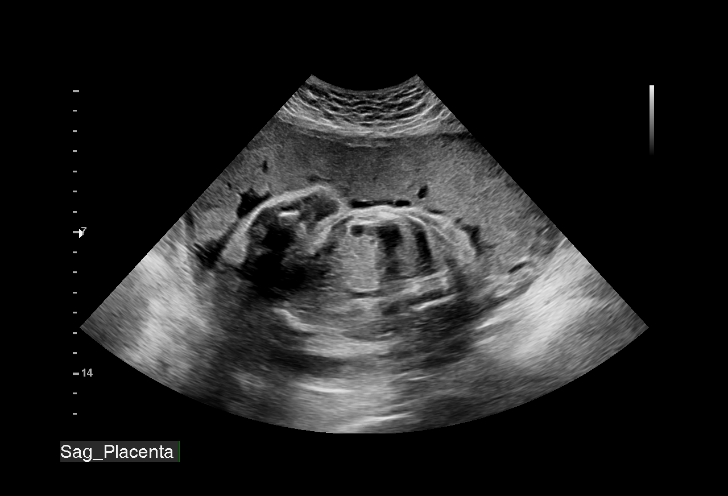
[im 60/78]
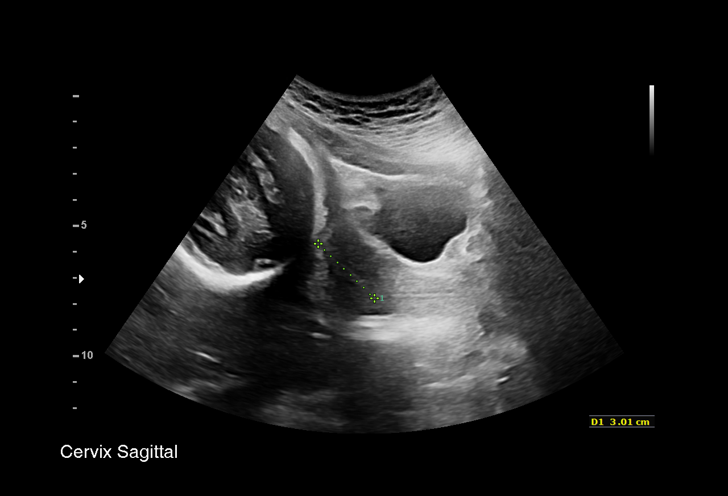
[im 66/78]
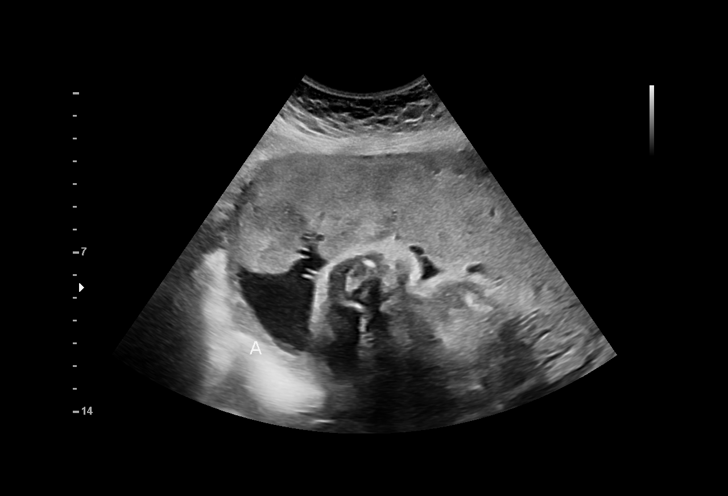
[im 72/78]
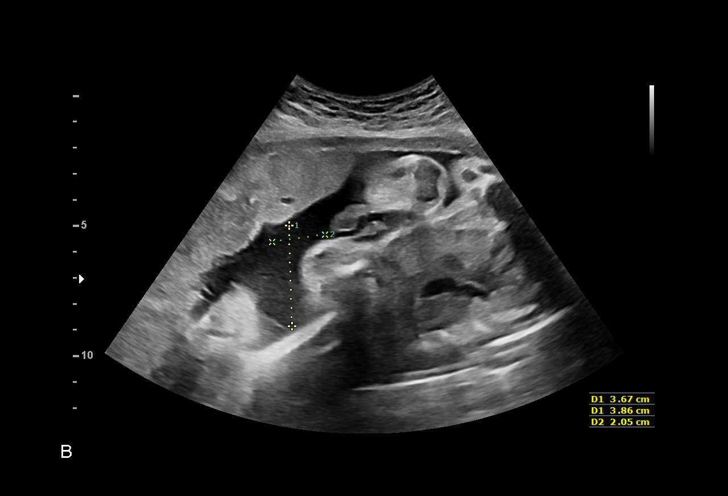
[im 78/78]
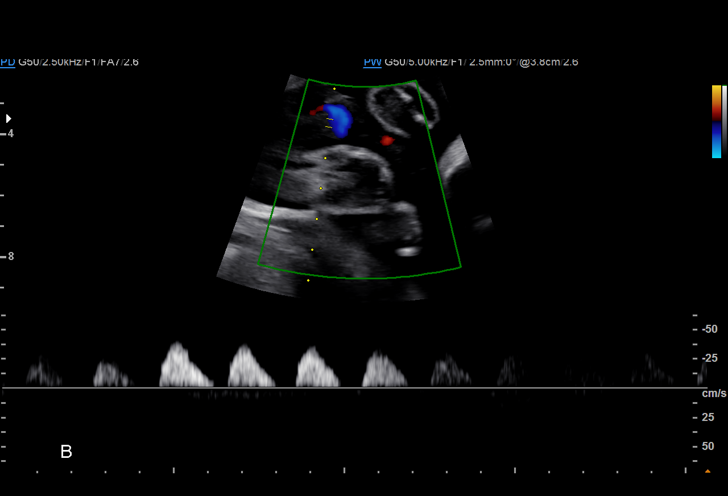

[14 of 28 positions shown; findings below may reference images not displayed]

[facility]yn Clinic

ADD'L GEST

1  RTOYOTA JOSHJAX            [id number]      [id number]     [id number]
2  RTOYOTA JOSHJAX            [id number]      [id number]     [id number]
3  RTOYOTA JOSHJAX            [id number]      [id number]     [id number]
4  RTOYOTA JOSHJAX            [id number]      [id number]     [id number]
Indications

29 weeks gestation of pregnancy
Absent end diastolic flow (Fetal and
placental problem in pregnancy) Twin B
Twin pregnancy, Bugliolo/Bones, third trimester
Maternal care for known or suspected poor
fetal growth, third trimester, fetus 2
OB History

Gravidity:    1         Term:   0        Prem:   0        SAB:   0
TOP:          0       Ectopic:  0        Living: 0
Fetal Evaluation (Fetus A)

Num Of Fetuses:     2
Fetal Heart         144
Rate(bpm):
Cardiac Activity:   Observed
Fetal Lie:          Maternal right side
Presentation:       Cephalic
Placenta:           Anterior, above cervical os
Membrane Desc:      Dividing Membrane seen - Monochorionic
Amniotic Fluid
AFI FV:      Subjectively within normal limits

Largest Pocket(cm)
2.8
Biophysical Evaluation (Fetus A)

Amniotic F.V:   Within normal limits       F. Tone:        Observed
F. Movement:    Observed                   Score:          [DATE]
F. Breathing:   Observed
Gestational Age (Fetus A)

LMP:           29w 1d        Date:  11/10/16                 EDD:   08/17/17
Best:          29w 1d     Det. By:  LMP  (11/10/16)          EDD:   08/17/17
Anatomy (Fetus A)

Abdomen:               Appears normal         Bladder:                Appears normal
Kidneys:               Appear normal
Doppler - Fetal Vessels (Fetus A)

Umbilical Artery
S/D     %tile                                     PSV    ADFV    RDFV
(cm/s)
2.[REDACTED]      No      No

Fetal Evaluation (Fetus B)

Num Of Fetuses:     2
Fetal Heart         148
Rate(bpm):
Cardiac Activity:   Observed
Fetal Lie:          Maternal left side
Presentation:       Cephalic
Placenta:           Anterior, above cervical os
Membrane Desc:      Dividing Membrane seen

Amniotic Fluid
AFI FV:      Subjectively within normal limits

Largest Pocket(cm)
3.7
Biophysical Evaluation (Fetus B)

Amniotic F.V:   Pocket => 2 cm two         F. Tone:        Observed
planes
F. Movement:    Observed                   Score:          [DATE]
F. Breathing:   Observed
Gestational Age (Fetus B)

LMP:           29w 1d        Date:  11/10/16                 EDD:   08/17/17
Best:          29w 1d     Det. By:  LMP  (11/10/16)          EDD:   08/17/17
Anatomy (Fetus B)

Stomach:               Appears normal, left   Bladder:                Appears normal
sided
Kidneys:               Appear normal
Doppler - Fetal Vessels (Fetus B)

Umbilical Artery
ADFV    RDFV
Yes     Yes

Cervix Uterus Adnexa

Cervix
Length:           3.01  cm.
Normal appearance by transabdominal scan.

Uterus
No abnormality visualized.

Left Ovary
Septated cyst again seen

Right Ovary
Previously seen
Impression

Monochorionic/diamniotic twin pregnancy at 29+1 weeks

Twin A:
Cephalic presentation
Low normal amniotic fluid volume
BPP [DATE]
UA dopplers were normal for this GA

Twin B:
FGR
Cephalic presentation
Normal amniotic fluid volume
BPP [DATE]
UA dopplers: most tracings showed AEDF; a few revealed
reverse EDF
Recommendations

Continue to monitor 3 times per day
Repeat dopplers [REDACTED]
Growth US
